# Patient Record
Sex: Female | Born: 1990 | Race: White | Hispanic: No | Marital: Married | Smoking: Never smoker
Health system: Southern US, Community
[De-identification: ages and names within clinical notes are randomized; demographics above are authoritative.]

## PROBLEM LIST (undated history)

## (undated) DIAGNOSIS — S83211A Bucket-handle tear of medial meniscus, current injury, right knee, initial encounter: Secondary | ICD-10-CM

## (undated) DIAGNOSIS — M23321 Other meniscus derangements, posterior horn of medial meniscus, right knee: Secondary | ICD-10-CM

## (undated) DIAGNOSIS — S83519A Sprain of anterior cruciate ligament of unspecified knee, initial encounter: Secondary | ICD-10-CM

## (undated) DIAGNOSIS — S83511A Sprain of anterior cruciate ligament of right knee, initial encounter: Secondary | ICD-10-CM

## (undated) HISTORY — PX: NO PAST SURGERIES: SHX2092

---
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
## 2011-03-04 NOTE — L&D Delivery Note (Signed)
NSD of viable 7 pound 4 oz. Female, 9/9 Apgars over intact perineum.  PDI.  Grossly normal.  2A/1V.  Small vaginal tears repaired with 3-0 Vicryl.  Breast feed.

## 2011-03-12 ENCOUNTER — Encounter (HOSPITAL_COMMUNITY): Payer: Self-pay | Admitting: Anesthesiology

## 2011-03-12 ENCOUNTER — Encounter (HOSPITAL_COMMUNITY): Payer: Self-pay | Admitting: *Deleted

## 2011-03-12 ENCOUNTER — Inpatient Hospital Stay (HOSPITAL_COMMUNITY): Payer: 59 | Admitting: Anesthesiology

## 2011-03-12 ENCOUNTER — Inpatient Hospital Stay (HOSPITAL_COMMUNITY)
Admission: AD | Admit: 2011-03-12 | Discharge: 2011-03-13 | DRG: 775 | Disposition: A | Discharge status: Home / Self Care | Payer: 59 | Source: Ambulatory Visit | Attending: Obstetrics and Gynecology | Admitting: Obstetrics and Gynecology

## 2011-03-12 LAB — GC/CHLAMYDIA PROBE AMP, GENITAL: Chlamydia: NEGATIVE

## 2011-03-12 LAB — ABO/RH: RH Type: POSITIVE

## 2011-03-12 LAB — CBC
HCT: 37 % (ref 36.0–46.0)
MCHC: 33.5 g/dL (ref 30.0–36.0)
MCV: 92.5 fL (ref 78.0–100.0)
RDW: 13.1 % (ref 11.5–15.5)

## 2011-03-12 LAB — HEPATITIS B SURFACE ANTIGEN: Hepatitis B Surface Ag: NEGATIVE

## 2011-03-12 MED ORDER — SODIUM CHLORIDE 0.9 % IJ SOLN: 3.0000 mL | INTRAMUSCULAR | Status: DC | PRN

## 2011-03-12 MED ORDER — FENTANYL 2.5 MCG/ML BUPIVACAINE 1/10 % EPIDURAL INFUSION (WH - ANES)
14.0000 mL/h | INTRAMUSCULAR | Status: DC
  Administered 2011-03-12: 16 mL/h via EPIDURAL
  Filled 2011-03-12: qty 60

## 2011-03-12 MED ORDER — IBUPROFEN 600 MG PO TABS
600.0000 mg | ORAL_TABLET | Freq: Four times a day (QID) | ORAL | Status: DC
  Administered 2011-03-12 – 2011-03-13 (×4): 600 mg via ORAL
  Filled 2011-03-12 (×5): qty 1

## 2011-03-12 MED ORDER — SENNOSIDES-DOCUSATE SODIUM 8.6-50 MG PO TABS
2.0000 | ORAL_TABLET | Freq: Every day | ORAL | Status: DC
  Administered 2011-03-12: 2 via ORAL

## 2011-03-12 MED ORDER — EPHEDRINE 5 MG/ML INJ: 10.0000 mg | INTRAVENOUS | Status: DC | PRN

## 2011-03-12 MED ORDER — ZOLPIDEM TARTRATE 5 MG PO TABS: 5.0000 mg | ORAL_TABLET | Freq: Every evening | ORAL | Status: DC | PRN

## 2011-03-12 MED ORDER — OXYCODONE-ACETAMINOPHEN 5-325 MG PO TABS: 2.0000 | ORAL_TABLET | ORAL | Status: DC | PRN

## 2011-03-12 MED ORDER — OXYTOCIN 20 UNITS IN LACTATED RINGERS INFUSION - SIMPLE
1.0000 m[IU]/min | INTRAVENOUS | Status: DC
  Administered 2011-03-12: 2 m[IU]/min via INTRAVENOUS

## 2011-03-12 MED ORDER — ONDANSETRON HCL 4 MG/2ML IJ SOLN: 4.0000 mg | Freq: Four times a day (QID) | INTRAMUSCULAR | Status: DC | PRN

## 2011-03-12 MED ORDER — OXYTOCIN BOLUS FROM INFUSION
500.0000 mL | Freq: Once | INTRAVENOUS | Status: DC
  Filled 2011-03-12: qty 1000
  Filled 2011-03-12: qty 500

## 2011-03-12 MED ORDER — PRENATAL MULTIVITAMIN CH
1.0000 | ORAL_TABLET | Freq: Every day | ORAL | Status: DC
  Administered 2011-03-13: 1 via ORAL
  Filled 2011-03-12: qty 1

## 2011-03-12 MED ORDER — LIDOCAINE HCL (PF) 1 % IJ SOLN
30.0000 mL | INTRAMUSCULAR | Status: DC | PRN
  Filled 2011-03-12: qty 30

## 2011-03-12 MED ORDER — IBUPROFEN 600 MG PO TABS
600.0000 mg | ORAL_TABLET | Freq: Four times a day (QID) | ORAL | Status: DC | PRN
  Administered 2011-03-12: 600 mg via ORAL
  Filled 2011-03-12: qty 1

## 2011-03-12 MED ORDER — LACTATED RINGERS IV SOLN: 500.0000 mL | Freq: Once | INTRAVENOUS | Status: DC

## 2011-03-12 MED ORDER — LIDOCAINE HCL 1.5 % IJ SOLN
INTRAMUSCULAR | Status: DC | PRN
  Administered 2011-03-12: 3 mL via INTRADERMAL
  Administered 2011-03-12 (×2): 4 mL via INTRADERMAL

## 2011-03-12 MED ORDER — EPHEDRINE 5 MG/ML INJ
10.0000 mg | INTRAVENOUS | Status: DC | PRN
  Filled 2011-03-12: qty 4

## 2011-03-12 MED ORDER — DIBUCAINE 1 % RE OINT
1.0000 "application " | TOPICAL_OINTMENT | RECTAL | Status: DC | PRN
  Administered 2011-03-12: 1 via RECTAL
  Filled 2011-03-12: qty 28

## 2011-03-12 MED ORDER — ONDANSETRON HCL 4 MG/2ML IJ SOLN: 4.0000 mg | INTRAMUSCULAR | Status: DC | PRN

## 2011-03-12 MED ORDER — LACTATED RINGERS IV SOLN
500.0000 mL | INTRAVENOUS | Status: DC | PRN
  Administered 2011-03-12: 1000 mL via INTRAVENOUS

## 2011-03-12 MED ORDER — ACETAMINOPHEN 325 MG PO TABS: 650.0000 mg | ORAL_TABLET | ORAL | Status: DC | PRN

## 2011-03-12 MED ORDER — FLEET ENEMA 7-19 GM/118ML RE ENEM: 1.0000 | ENEMA | RECTAL | Status: DC | PRN

## 2011-03-12 MED ORDER — TETANUS-DIPHTH-ACELL PERTUSSIS 5-2.5-18.5 LF-MCG/0.5 IM SUSP: 0.5000 mL | Freq: Once | INTRAMUSCULAR | Status: DC

## 2011-03-12 MED ORDER — WITCH HAZEL-GLYCERIN EX PADS
1.0000 "application " | MEDICATED_PAD | CUTANEOUS | Status: DC | PRN
  Administered 2011-03-12: 1 via TOPICAL

## 2011-03-12 MED ORDER — SODIUM CHLORIDE 0.9 % IV SOLN: 250.0000 mL | INTRAVENOUS | Status: DC | PRN

## 2011-03-12 MED ORDER — DIPHENHYDRAMINE HCL 25 MG PO CAPS: 25.0000 mg | ORAL_CAPSULE | Freq: Four times a day (QID) | ORAL | Status: DC | PRN

## 2011-03-12 MED ORDER — OXYCODONE-ACETAMINOPHEN 5-325 MG PO TABS: 1.0000 | ORAL_TABLET | ORAL | Status: DC | PRN

## 2011-03-12 MED ORDER — PHENYLEPHRINE 40 MCG/ML (10ML) SYRINGE FOR IV PUSH (FOR BLOOD PRESSURE SUPPORT): 80.0000 ug | PREFILLED_SYRINGE | INTRAVENOUS | Status: DC | PRN

## 2011-03-12 MED ORDER — LANOLIN HYDROUS EX OINT: TOPICAL_OINTMENT | CUTANEOUS | Status: DC | PRN

## 2011-03-12 MED ORDER — TERBUTALINE SULFATE 1 MG/ML IJ SOLN: 0.2500 mg | Freq: Once | INTRAMUSCULAR | Status: DC | PRN

## 2011-03-12 MED ORDER — ONDANSETRON HCL 4 MG PO TABS: 4.0000 mg | ORAL_TABLET | ORAL | Status: DC | PRN

## 2011-03-12 MED ORDER — CITRIC ACID-SODIUM CITRATE 334-500 MG/5ML PO SOLN: 30.0000 mL | ORAL | Status: DC | PRN

## 2011-03-12 MED ORDER — BENZOCAINE-MENTHOL 20-0.5 % EX AERO
INHALATION_SPRAY | CUTANEOUS | Status: AC
  Administered 2011-03-12: 1 via TOPICAL
  Filled 2011-03-12: qty 56

## 2011-03-12 MED ORDER — DIPHENHYDRAMINE HCL 50 MG/ML IJ SOLN: 12.5000 mg | INTRAMUSCULAR | Status: DC | PRN

## 2011-03-12 MED ORDER — SODIUM CHLORIDE 0.9 % IJ SOLN: 3.0000 mL | Freq: Two times a day (BID) | INTRAMUSCULAR | Status: DC

## 2011-03-12 MED ORDER — OXYTOCIN 20 UNITS IN LACTATED RINGERS INFUSION - SIMPLE
125.0000 mL/h | Freq: Once | INTRAVENOUS | Status: AC
  Administered 2011-03-12: 125 mL/h via INTRAVENOUS

## 2011-03-12 MED ORDER — BENZOCAINE-MENTHOL 20-0.5 % EX AERO
1.0000 "application " | INHALATION_SPRAY | CUTANEOUS | Status: DC | PRN
  Administered 2011-03-12: 1 via TOPICAL

## 2011-03-12 MED ORDER — PHENYLEPHRINE 40 MCG/ML (10ML) SYRINGE FOR IV PUSH (FOR BLOOD PRESSURE SUPPORT)
80.0000 ug | PREFILLED_SYRINGE | INTRAVENOUS | Status: DC | PRN
  Filled 2011-03-12: qty 5

## 2011-03-12 MED ORDER — SIMETHICONE 80 MG PO CHEW: 80.0000 mg | CHEWABLE_TABLET | ORAL | Status: DC | PRN

## 2011-03-12 MED ORDER — LACTATED RINGERS IV SOLN
INTRAVENOUS | Status: DC
  Administered 2011-03-12: 05:00:00 via INTRAVENOUS

## 2011-03-12 NOTE — H&P (Signed)
21 y.o. @ 39.2  G1P0 comes in c/o SROM.  Otherwise has good fetal movement and no bleeding.  History reviewed. No pertinent past medical history. History reviewed. No pertinent past surgical history.  OB History    Grav Para Term Preterm Abortions TAB SAB Ect Mult Living   1              # Outc Date GA Lbr Len/2nd Wgt Sex Del Anes PTL Lv   1 CUR               History   Social History  . Marital Status: Married    Spouse Name: N/A    Number of Children: N/A  . Years of Education: N/A   Occupational History  . Not on file.   Social History Main Topics  . Smoking status: Never Smoker   . Smokeless tobacco: Never Used  . Alcohol Use: No  . Drug Use: No  . Sexually Active: Yes    Birth Control/ Protection: None   Other Topics Concern  . Not on file   Social History Narrative  . No narrative on file   Review of patient's allergies indicates no known allergies.   Prenatal Course:  Uncomplicated, although no-showed 3 appointments.  All PNLs wnl.  Filed Vitals:   03/12/11 0413  BP: 130/74  Pulse: 84  Temp: 98.1 F (36.7 C)  Resp: 20    Per MAU staff: Lungs/Cor:  NAD Abdomen:  soft, gravid Ex:  no cords, erythema SVE:  4/90/-2, head well applied FHTs:  120, good STV, NST R Toco:  q 6-7   A/P   Admit to L&D  GBS Neg, A+, RI  Epidural when desired  Continuous EFM/ TOCO  Start pitocin 2x2 if no change with next cervical check  Veda Arrellano

## 2011-03-12 NOTE — Anesthesia Procedure Notes (Signed)
Epidural Patient location during procedure: OB Start time: 03/12/2011 7:23 AM Reason for block: procedure for pain  Staffing Performed by: anesthesiologist   Preanesthetic Checklist Completed: patient identified, site marked, surgical consent, pre-op evaluation, timeout performed, IV checked, risks and benefits discussed and monitors and equipment checked  Epidural Patient position: sitting Prep: site prepped and draped and DuraPrep Patient monitoring: continuous pulse ox and blood pressure Approach: midline Injection technique: LOR air  Needle:  Needle type: Tuohy  Needle gauge: 17 G Needle length: 9 cm Needle insertion depth: 5 cm cm Catheter type: closed end flexible Catheter size: 19 Gauge Catheter at skin depth: 10 cm Test dose: negative  Assessment Events: blood not aspirated, injection not painful, no injection resistance, negative IV test and no paresthesia  Additional Notes Discussed risk of headache, infection, bleeding, nerve injury and failed or incomplete block.  Patient voices understanding and wishes to proceed.

## 2011-03-12 NOTE — Anesthesia Postprocedure Evaluation (Signed)
  Anesthesia Post-op Note  Patient: Debra Fox  Procedure(s) Performed: * No procedures listed *  Patient Location: PACU and Mother/Baby  Anesthesia Type: Epidural  Level of Consciousness: awake, alert  and oriented  Airway and Oxygen Therapy: Patient Spontanous Breathing and Patient connected to face mask oxygen  Post-op Pain: mild  Post-op Assessment: Post-op Vital signs reviewed, No headache, No backache, No residual numbness and No residual motor weakness  Post-op Vital Signs: stable  Complications: No apparent anesthesia complications

## 2011-03-12 NOTE — Progress Notes (Signed)
Delivery of live viable female by Dr Aldona Bar. APGARS 9,9

## 2011-03-12 NOTE — Anesthesia Preprocedure Evaluation (Signed)
Anesthesia Evaluation  Patient identified by MRN, date of birth, ID band Patient awake    Reviewed: Allergy & Precautions, H&P , NPO status , Patient's Chart, lab work & pertinent test results, reviewed documented beta blocker date and time   History of Anesthesia Complications Negative for: history of anesthetic complications  Airway Mallampati: II TM Distance: >3 FB Neck ROM: full    Dental  (+) Teeth Intact   Pulmonary neg pulmonary ROS,  clear to auscultation        Cardiovascular neg cardio ROS regular Normal    Neuro/Psych Negative Neurological ROS  Negative Psych ROS   GI/Hepatic negative GI ROS, Neg liver ROS,   Endo/Other  Negative Endocrine ROS  Renal/GU negative Renal ROS     Musculoskeletal   Abdominal   Peds  Hematology negative hematology ROS (+)   Anesthesia Other Findings   Reproductive/Obstetrics (+) Pregnancy                           Anesthesia Physical Anesthesia Plan  ASA: II  Anesthesia Plan: Epidural   Post-op Pain Management:    Induction:   Airway Management Planned:   Additional Equipment:   Intra-op Plan:   Post-operative Plan:   Informed Consent: I have reviewed the patients History and Physical, chart, labs and discussed the procedure including the risks, benefits and alternatives for the proposed anesthesia with the patient or authorized representative who has indicated his/her understanding and acceptance.     Plan Discussed with:   Anesthesia Plan Comments:         Anesthesia Quick Evaluation  

## 2011-03-13 LAB — CBC
HCT: 35.6 % — ABNORMAL LOW (ref 36.0–46.0)
RDW: 13.5 % (ref 11.5–15.5)
WBC: 11.1 10*3/uL — ABNORMAL HIGH (ref 4.0–10.5)

## 2011-03-13 NOTE — Progress Notes (Signed)
UR chart review completed.  

## 2011-03-13 NOTE — Progress Notes (Signed)
Patient is eating, ambulating, voiding.  Pain control is good.  Filed Vitals:   03/12/11 1330 03/12/11 2233 03/13/11 0130 03/13/11 0557  BP: 113/64 122/70 107/72 128/82  Pulse: 87 80 87 81  Temp: 98.5 F (36.9 C) 98.8 F (37.1 C) 97.6 F (36.4 C) 98.1 F (36.7 C)  TempSrc: Oral Oral Oral Oral  Resp: 18 20 20 18   Height:      Weight:      SpO2:        Fundus firm Perineum without swelling.  Lab Results  Component Value Date   WBC 11.1* 03/13/2011   HGB 11.7* 03/13/2011   HCT 35.6* 03/13/2011   MCV 94.2 03/13/2011   PLT 206 03/13/2011    A/Positive/-- (01/09 0000)/RI  A/P Post partum day 1.  Routine care.  Expect d/c tomorrow.    Milina Pagett A

## 2011-03-13 NOTE — Discharge Summary (Signed)
Obstetric Discharge Summary Reason for Admission: onset of labor Prenatal Procedures: none Intrapartum Procedures: spontaneous vaginal delivery Postpartum Procedures: none Complications-Operative and Postpartum: none Hemoglobin  Date Value Range Status  03/13/2011 11.7* 12.0-15.0 (g/dL) Final     HCT  Date Value Range Status  03/13/2011 35.6* 36.0-46.0 (%) Final    Discharge Diagnoses: Term Pregnancy-delivered  Discharge Information: Date: 03/13/2011 Activity: pelvic rest Diet: routine Medications: Ibuprofen Condition: stable Instructions: refer to practice specific booklet Discharge to: home Follow-up Information    Follow up with [facility] in 4 weeks.   Contact information:   [address] [place] [address] [phone]          Newborn Data: Live born female  Birth Weight: 7 lb 4 oz (3289 g) APGAR: 9, 9  Home with mother.  Debra Fox A 03/13/2011, 2:57 PM

## 2011-03-13 NOTE — Progress Notes (Signed)
Pt desires d/c- ok for baby to be d/ced as well. Kesia Dalto A

## 2011-03-20 ENCOUNTER — Ambulatory Visit (HOSPITAL_COMMUNITY)
Admission: RE | Admit: 2011-03-20 | Discharge: 2011-03-20 | Disposition: A | Discharge status: Home / Self Care | Payer: 59 | Source: Ambulatory Visit | Attending: Obstetrics and Gynecology | Admitting: Obstetrics and Gynecology

## 2011-03-20 NOTE — Progress Notes (Signed)
Adult Lactation Consultation Outpatient Visit Note  Patient Name: Carmie Loree Fee Date of Birth: 05-Mar-1990    03/12/2011 Gestational Age at Delivery: [redacted]w[redacted]d Type of Delivery: vaginal  Breastfeeding History: Frequency of Breastfeeding: 2-3 hrs during the day, and 1 4 hr stretch at night Length of Feeding: 30 mins Voids:  2-3 Stools: 3-5 yellow   Supplementing / Method: Pumping:  Type of Pump: manual pump   Frequency: once a day  Volume:  4 oz, but decreased to 1 oz recently  Comments: Mom called and was concerned that her milk supply was decreasing.  She was using the manual breast pump and the volume decreased from 4 oz to 1 oz past couple days, when she would pump 15 mins.  Also, her breasts went from very full and heavy, and now feel softer. Nipples feel sore on initial latch, but it improves as Mom adjusts baby's latch while she is feeding.  Mom pumps using a manual pump 1 time a day and bottle feeds baby 1oz of EBM.  Baby weighed 7 lbs 4 oz at birth, at day after discharge weighed 6 lbs. 8 oz. (1/11), and today at 83 days old, baby weighs 7 lbs. 1.9 oz which is consistent with an adequate weight gain of 1 oz per day.   Consultation Evaluation: Mom denies breast surgery, medications, or smoking.  Bilateral nipple trauma, some blisters, and some scabbed cracks noted, more on right.  Mom states they were bleeding, but has gotten much better.  Wearing sore nipple shells, and using lanolin.  In office, Mom latched baby too shallow, so assisted her to position baby and latch more quickly and deeply resulting in a more comfortable latch per Mom.  Baby nursed rhythmically, but only obtained 12 ml from left side.  Switched to 2nd breast, and baby latched very deeply, and baby fed for 20 mins, but only obtained 14 ml.  Mom says she has not supplemented baby.  Baby's weight gain is right on target~ 1 oz per day.  Talked about each feeding at breast being different.  Recommended a DEBP for  stimulating her milk supply, but wants to wait to get one from her insurance policy.  She will call and check on this tomorrow.  Until then, she has a manual breast pump and will use that as needed.  Instructions to prevent and treat sore nipples given: -Make sure infant's mouth is opened wide before she is pulled in close and that the infant gets as much of the areola as possible into her mouth allowing nose to be close to breast. -Baby's lips should be flanged outward to improve seal around areola -Do not press down to make "air space" for baby's nose, as this will pull nipple out of correct position in -Remove the baby from the breast by breaking the suction, inserting finger into corner of baby's mouth -Begin feeding on the side that is least sore first -Hand or pump express a few minutes prior to feeding to initiate flow -Alternate nursing positions for shorter duration at each latch -rub expressed breastmilk onto nipple and allow to air dry -wear breast shells between feeding to encourage air circulation and to promote healing -may use analgesic as prescribed by MD or lanolin as necessary if not at risk for Thrush or allergic to wool.   Follow-Up Call our office if wet diapers decrease to <6 per day and stools <2 day Next appointment 03/25/11 at 9am for another feeding assessment  Judee Clara 03/20/2011, 2:00 PM

## 2011-03-25 ENCOUNTER — Ambulatory Visit (HOSPITAL_COMMUNITY)
Admission: RE | Admit: 2011-03-25 | Discharge: 2011-03-25 | Disposition: A | Discharge status: Home / Self Care | Payer: 59 | Source: Ambulatory Visit | Attending: Obstetrics and Gynecology | Admitting: Obstetrics and Gynecology

## 2011-03-25 NOTE — Progress Notes (Signed)
Adult Lactation Consultation Outpatient Visit Note  Patient Name: Keltie Labell                 BABY:  LYLAH BRIDGES Date of Birth: [birth year]                           DOB:  [birth year]  24 DAYS OLD Gestational Age at Delivery: [redacted]w[redacted]d        BIRTH WEIGHT: 7-4     WEIGHT TODAY:  7-2.4 Type of Delivery:   Breastfeeding History: Frequency of Breastfeeding: EVERY 3-4 HOURS Length of Feeding: 20 30 MINUTES TOTAL, BOTH BREASTS EACH FEEDING Voids: QS Stools: QS  Supplementing / Method:   BOTTLE/EBM  4-8 OZ/DAY Pumping:  Type of Pump:PUMP IN STYLE   Frequency: TWICE PER DAY  Volume:  3 OZ EACH BREAST  Comments:MOM FEELS LIKE BREASTFEEDING AND NIPPLE PAIN HAS IMPROVED GREATLY SINCE LAST WEEK.  ALTHOUGH BABY HAS ONLY GAINED .5 OZ/7 DAYS.  MOTHER SOMETIMES LETTING BABY GO 4 HOURS BETWEEN FEEDINGS.  NIPPLES HEALED.  BABY OBSERVED ON BOTH BREASTS WITH DEEP LATCH.  TOTAL PC MILK TRANSFER AFTER 25 MINUTES 40 MLS.  PLAN IS TO INCREASE FREQUENCY OF FEEDINGS, PUMP BOTH BREASTS X 15 MINUTES AFTER FEEDS AND SUPPLEMENT EACH FEEDING WITH 30-40 MLS.  WEIGHT CHECK IN ONE WEEK.    Consultation Evaluation:  Initial Feeding Assessment: Pre-feed [id number]:0454 Post-feed UJWJXB:1478 Amount Transferred:22 MLS Comments:  Additional Feeding Assessment: Pre-feed [id number]:3086 Post-feed VHQION:6295 Amount Transferred:18 MLS Comments:  Additional Feeding Assessment: Pre-feed Weight: Post-feed Weight: Amount Transferred: Comments:  Total Breast milk Transferred this Visit:  Total Supplement Given:   Additional Interventions:   Follow-Up  WEIGHT CHECK 04/01/11 1:00PM      Hansel Feinstein 03/25/2011, 10:17 AM

## 2011-04-01 ENCOUNTER — Inpatient Hospital Stay (HOSPITAL_COMMUNITY): Admission: RE | Admit: 2011-04-01 | Payer: 59 | Source: Ambulatory Visit

## 2011-04-18 ENCOUNTER — Ambulatory Visit (HOSPITAL_COMMUNITY)
Admission: RE | Admit: 2011-04-18 | Discharge: 2011-04-18 | Disposition: A | Discharge status: Home / Self Care | Payer: 59 | Source: Ambulatory Visit | Attending: Obstetrics and Gynecology | Admitting: Obstetrics and Gynecology

## 2011-04-18 NOTE — Progress Notes (Signed)
Adult Lactation Consultation Outpatient Visit Note  Patient Name: Debra Fox Date of Birth: 29-Jan-1991 Gestational Age at Delivery: Unknown Type of Delivery:   Breastfeeding History: Frequency of Breastfeeding:  Length of Feeding:  Voids:  Stools:   Supplementing / Method: Pumping:  Type of Pump:free style medela   Frequency:8 times daily  Volume:  noramally 6-8 ounces, decreased since mastitis to 3-4 ounces per pumping  Comments: Follow visit for mastitis. New since last visit is itchy , burning painful nipples   Consultation Evaluation: [name] nipples are deep pink and both have tiny broken areas that appear to be like popped blisters. Nipples have yellow/white crusty patches in center of nipple. Mother states that she has been observing problem x 1 week. She does complain of burning and stinging when air hits nipples. Mother also states that she has had pain when pumping.   Initial Feeding Assessment: Pre-feed Weight: Post-feed Weight: Amount Transferred: Comments:  Additional Feeding Assessment: Pre-feed Weight: Post-feed Weight: Amount Transferred: Comments:  Additional Feeding Assessment: Pre-feed Weight: Post-feed Weight: Amount Transferred: Comments:  Total Breast milk Transferred this Visit:  Total Supplement Given:   Additional Interventions: Recommended that [name] be treated for yeast systems. inst to phone OB to get DR Newmans nipple cream and Dyflucan inst to call Dr Arletha Grippe and ask about Nystatin sus. for infant.  Mother was given teaching sheet for yeast. Mother was given #27 flanges to provide for better comfort during pumping.  Follow-Up  PRN    Stevan Born [facility] 04/18/2011, 3:33 PM

## 2013-03-03 NOTE — L&D Delivery Note (Signed)
Delivery Note At 5:22 AM a viable and healthy female was delivered via  (Presentation: OA).  APGAR: 9,9; weight -pending Placenta status: spontaneous, intact, but membranes removed by manual exploration, .  Cord: 3 vessel, normal,  with the following complications: -none/   Anesthesia: Epidural  Episiotomy: na Lacerations: none Suture Repair: na Est. Blood Loss (mL): 350cc  Mom to postpartum.  Baby to Couplet care / Skin to Skin.  Aleila Syverson R 12/05/2013, 5:56 AM

## 2013-12-04 ENCOUNTER — Inpatient Hospital Stay (HOSPITAL_COMMUNITY)
Admission: AD | Admit: 2013-12-04 | Discharge: 2013-12-04 | Disposition: A | Discharge status: Home / Self Care | Payer: 59 | Source: Ambulatory Visit | Attending: Obstetrics & Gynecology | Admitting: Obstetrics & Gynecology

## 2013-12-04 ENCOUNTER — Encounter (HOSPITAL_COMMUNITY): Payer: Self-pay

## 2013-12-04 DIAGNOSIS — O9989 Other specified diseases and conditions complicating pregnancy, childbirth and the puerperium: Secondary | ICD-10-CM | POA: Diagnosis present

## 2013-12-04 LAB — POCT FERN TEST

## 2013-12-04 LAB — AMNISURE RUPTURE OF MEMBRANE (ROM) NOT AT ARMC: AMNISURE: NEGATIVE

## 2013-12-04 NOTE — MAU Note (Signed)
Pt presents to MAU with complaints of LOF since 5am. Denies any vaginal bleeding.

## 2013-12-04 NOTE — MAU Note (Signed)
Pt states leaking of fluid at 0500 this am. Blood tinged. Was 1cm in office last visit.

## 2013-12-04 NOTE — Progress Notes (Signed)
Pt here for r/o rupture, c/o trickle of fluid. Notified pt had negative fern slide, order for amnisure.

## 2013-12-04 NOTE — Discharge Instructions (Signed)
Third Trimester of Pregnancy The third trimester is from week 29 through week 42, months 7 through 9. This trimester is when your unborn baby (fetus) is growing very fast. At the end of the ninth month, the unborn baby is about 20 inches in length. It weighs about 6-10 pounds.  HOME CARE   Avoid all smoking, herbs, and alcohol. Avoid drugs not approved by your doctor.  Only take medicine as told by your doctor. Some medicines are safe and some are not during pregnancy.  Exercise only as told by your doctor. Stop exercising if you start having cramps.  Eat regular, healthy meals.  Wear a good support bra if your breasts are tender.  Do not use hot tubs, steam rooms, or saunas.  Wear your seat belt when driving.  Avoid raw meat, uncooked cheese, and liter boxes and soil used by cats.  Take your prenatal vitamins.  Try taking medicine that helps you poop (stool softener) as needed, and if your doctor approves. Eat more fiber by eating fresh fruit, vegetables, and whole grains. Drink enough fluids to keep your pee (urine) clear or pale yellow.  Take warm water baths (sitz baths) to soothe pain or discomfort caused by hemorrhoids. Use hemorrhoid cream if your doctor approves.  If you have puffy, bulging veins (varicose veins), wear support hose. Raise (elevate) your feet for 15 minutes, 3-4 times a day. Limit salt in your diet.  Avoid heavy lifting, wear low heels, and sit up straight.  Rest with your legs raised if you have leg cramps or low back pain.  Visit your dentist if you have not gone during your pregnancy. Use a soft toothbrush to brush your teeth. Be gentle when you floss.  You can have sex (intercourse) unless your doctor tells you not to.  Do not travel far distances unless you must. Only do so with your doctor's approval.  Take prenatal classes.  Practice driving to the hospital.  Pack your hospital bag.  Prepare the baby's room.  Go to your doctor visits. GET  HELP IF:  You are not sure if you are in labor or if your water has broken.  You are dizzy.  You have mild cramps or pressure in your lower belly (abdominal).  You have a nagging pain in your belly area.  You continue to feel sick to your stomach (nauseous), throw up (vomit), or have watery poop (diarrhea).  You have bad smelling fluid coming from your vagina.  You have pain with peeing (urination). GET HELP RIGHT AWAY IF:   You have a fever.  You are leaking fluid from your vagina.  You are spotting or bleeding from your vagina.  You have severe belly cramping or pain.  You lose or gain weight rapidly.  You have trouble catching your breath and have chest pain.  You notice sudden or extreme puffiness (swelling) of your face, hands, ankles, feet, or legs.  You have not felt the baby move in over an hour.  You have severe headaches that do not go away with medicine.  You have vision changes. Document Released: 05/14/2009 Document Revised: 06/14/2012 Document Reviewed: 04/20/2012 ExitCare Patient Information 2015 [employer]. This information is not intended to replace advice given to you by your health care provider. Make sure you discuss any questions you have with your health care provider.  

## 2013-12-04 NOTE — Progress Notes (Signed)
Notified amnisure negative, will d/c home with labor precautions.

## 2013-12-05 ENCOUNTER — Inpatient Hospital Stay (HOSPITAL_COMMUNITY): Payer: 59 | Admitting: Anesthesiology

## 2013-12-05 ENCOUNTER — Inpatient Hospital Stay (HOSPITAL_COMMUNITY)
Admission: AD | Admit: 2013-12-05 | Discharge: 2013-12-06 | DRG: 774 | Disposition: A | Discharge status: Home / Self Care | Payer: 59 | Source: Ambulatory Visit | Attending: Obstetrics & Gynecology | Admitting: Obstetrics & Gynecology

## 2013-12-05 ENCOUNTER — Encounter (HOSPITAL_COMMUNITY): Payer: 59 | Admitting: Anesthesiology

## 2013-12-05 ENCOUNTER — Encounter (HOSPITAL_COMMUNITY): Payer: Self-pay | Admitting: *Deleted

## 2013-12-05 DIAGNOSIS — O3663X0 Maternal care for excessive fetal growth, third trimester, not applicable or unspecified: Secondary | ICD-10-CM | POA: Diagnosis present

## 2013-12-05 DIAGNOSIS — IMO0001 Reserved for inherently not codable concepts without codable children: Secondary | ICD-10-CM

## 2013-12-05 DIAGNOSIS — O9832 Other infections with a predominantly sexual mode of transmission complicating childbirth: Secondary | ICD-10-CM | POA: Diagnosis present

## 2013-12-05 DIAGNOSIS — Z3A38 38 weeks gestation of pregnancy: Secondary | ICD-10-CM | POA: Diagnosis present

## 2013-12-05 DIAGNOSIS — A6000 Herpesviral infection of urogenital system, unspecified: Secondary | ICD-10-CM | POA: Diagnosis present

## 2013-12-05 DIAGNOSIS — Z3483 Encounter for supervision of other normal pregnancy, third trimester: Secondary | ICD-10-CM | POA: Diagnosis present

## 2013-12-05 LAB — OB RESULTS CONSOLE GBS: STREP GROUP B AG: NEGATIVE

## 2013-12-05 LAB — CBC
HCT: 37.6 % (ref 36.0–46.0)
Hemoglobin: 12.8 g/dL (ref 12.0–15.0)
MCH: 32.1 pg (ref 26.0–34.0)
MCHC: 34 g/dL (ref 30.0–36.0)
MCV: 94.2 fL (ref 78.0–100.0)
PLATELETS: 231 10*3/uL (ref 150–400)
RBC: 3.99 MIL/uL (ref 3.87–5.11)
RDW: 13.4 % (ref 11.5–15.5)
WBC: 12.5 10*3/uL — ABNORMAL HIGH (ref 4.0–10.5)

## 2013-12-05 LAB — ABO/RH: ABO/RH(D): A POS

## 2013-12-05 LAB — OB RESULTS CONSOLE RPR: RPR: NONREACTIVE

## 2013-12-05 LAB — TYPE AND SCREEN
ABO/RH(D): A POS
Antibody Screen: NEGATIVE

## 2013-12-05 LAB — OB RESULTS CONSOLE HIV ANTIBODY (ROUTINE TESTING): HIV: NONREACTIVE

## 2013-12-05 LAB — OB RESULTS CONSOLE HEPATITIS B SURFACE ANTIGEN: Hepatitis B Surface Ag: NEGATIVE

## 2013-12-05 LAB — RPR

## 2013-12-05 LAB — OB RESULTS CONSOLE RUBELLA ANTIBODY, IGM: RUBELLA: IMMUNE

## 2013-12-05 MED ORDER — SENNOSIDES-DOCUSATE SODIUM 8.6-50 MG PO TABS
2.0000 | ORAL_TABLET | ORAL | Status: DC
  Administered 2013-12-05: 2 via ORAL
  Filled 2013-12-05: qty 2

## 2013-12-05 MED ORDER — LACTATED RINGERS IV SOLN: 500.0000 mL | INTRAVENOUS | Status: DC | PRN

## 2013-12-05 MED ORDER — DIBUCAINE 1 % RE OINT: 1.0000 "application " | TOPICAL_OINTMENT | RECTAL | Status: DC | PRN

## 2013-12-05 MED ORDER — LACTATED RINGERS IV SOLN
500.0000 mL | Freq: Once | INTRAVENOUS | Status: AC
  Administered 2013-12-05: 500 mL via INTRAVENOUS

## 2013-12-05 MED ORDER — ONDANSETRON HCL 4 MG PO TABS: 4.0000 mg | ORAL_TABLET | ORAL | Status: DC | PRN

## 2013-12-05 MED ORDER — ONDANSETRON HCL 4 MG/2ML IJ SOLN: 4.0000 mg | Freq: Four times a day (QID) | INTRAMUSCULAR | Status: DC | PRN

## 2013-12-05 MED ORDER — ZOLPIDEM TARTRATE 5 MG PO TABS: 5.0000 mg | ORAL_TABLET | Freq: Every evening | ORAL | Status: DC | PRN

## 2013-12-05 MED ORDER — WITCH HAZEL-GLYCERIN EX PADS: 1.0000 "application " | MEDICATED_PAD | CUTANEOUS | Status: DC | PRN

## 2013-12-05 MED ORDER — OXYCODONE-ACETAMINOPHEN 5-325 MG PO TABS: 2.0000 | ORAL_TABLET | ORAL | Status: DC | PRN

## 2013-12-05 MED ORDER — SIMETHICONE 80 MG PO CHEW: 80.0000 mg | CHEWABLE_TABLET | ORAL | Status: DC | PRN

## 2013-12-05 MED ORDER — EPHEDRINE 5 MG/ML INJ
10.0000 mg | INTRAVENOUS | Status: DC | PRN
  Filled 2013-12-05: qty 2

## 2013-12-05 MED ORDER — DIPHENHYDRAMINE HCL 50 MG/ML IJ SOLN: 12.5000 mg | INTRAMUSCULAR | Status: DC | PRN

## 2013-12-05 MED ORDER — TETANUS-DIPHTH-ACELL PERTUSSIS 5-2.5-18.5 LF-MCG/0.5 IM SUSP
0.5000 mL | Freq: Once | INTRAMUSCULAR | Status: DC
Start: 2013-12-06 — End: 2013-12-05

## 2013-12-05 MED ORDER — LIDOCAINE HCL (PF) 1 % IJ SOLN
INTRAMUSCULAR | Status: DC | PRN
  Administered 2013-12-05: 5 mL
  Administered 2013-12-05: 4 mL

## 2013-12-05 MED ORDER — FENTANYL 2.5 MCG/ML BUPIVACAINE 1/10 % EPIDURAL INFUSION (WH - ANES)
INTRAMUSCULAR | Status: DC | PRN
  Administered 2013-12-05: 16 mL/h via EPIDURAL

## 2013-12-05 MED ORDER — ACETAMINOPHEN 325 MG PO TABS: 650.0000 mg | ORAL_TABLET | ORAL | Status: DC | PRN

## 2013-12-05 MED ORDER — IBUPROFEN 600 MG PO TABS
600.0000 mg | ORAL_TABLET | Freq: Four times a day (QID) | ORAL | Status: DC
  Administered 2013-12-05 – 2013-12-06 (×5): 600 mg via ORAL
  Filled 2013-12-05 (×5): qty 1

## 2013-12-05 MED ORDER — BENZOCAINE-MENTHOL 20-0.5 % EX AERO
1.0000 "application " | INHALATION_SPRAY | CUTANEOUS | Status: DC | PRN
  Administered 2013-12-05: 1 via TOPICAL
  Filled 2013-12-05: qty 56

## 2013-12-05 MED ORDER — OXYCODONE-ACETAMINOPHEN 5-325 MG PO TABS: 1.0000 | ORAL_TABLET | ORAL | Status: DC | PRN

## 2013-12-05 MED ORDER — DIPHENHYDRAMINE HCL 25 MG PO CAPS: 25.0000 mg | ORAL_CAPSULE | Freq: Four times a day (QID) | ORAL | Status: DC | PRN

## 2013-12-05 MED ORDER — PRENATAL MULTIVITAMIN CH
1.0000 | ORAL_TABLET | Freq: Every day | ORAL | Status: DC
Start: 2013-12-05 — End: 2013-12-06
  Administered 2013-12-05 – 2013-12-06 (×2): 1 via ORAL
  Filled 2013-12-05 (×2): qty 1

## 2013-12-05 MED ORDER — ONDANSETRON HCL 4 MG/2ML IJ SOLN: 4.0000 mg | INTRAMUSCULAR | Status: DC | PRN

## 2013-12-05 MED ORDER — OXYTOCIN 40 UNITS IN LACTATED RINGERS INFUSION - SIMPLE MED
62.5000 mL/h | INTRAVENOUS | Status: DC
  Filled 2013-12-05: qty 1000

## 2013-12-05 MED ORDER — PHENYLEPHRINE 40 MCG/ML (10ML) SYRINGE FOR IV PUSH (FOR BLOOD PRESSURE SUPPORT)
80.0000 ug | PREFILLED_SYRINGE | INTRAVENOUS | Status: DC | PRN
  Filled 2013-12-05: qty 2
  Filled 2013-12-05: qty 10

## 2013-12-05 MED ORDER — PHENYLEPHRINE 40 MCG/ML (10ML) SYRINGE FOR IV PUSH (FOR BLOOD PRESSURE SUPPORT)
80.0000 ug | PREFILLED_SYRINGE | INTRAVENOUS | Status: DC | PRN
  Filled 2013-12-05: qty 2

## 2013-12-05 MED ORDER — LACTATED RINGERS IV SOLN: INTRAVENOUS | Status: DC

## 2013-12-05 MED ORDER — LANOLIN HYDROUS EX OINT: TOPICAL_OINTMENT | CUTANEOUS | Status: DC | PRN

## 2013-12-05 MED ORDER — LIDOCAINE HCL (PF) 1 % IJ SOLN
30.0000 mL | INTRAMUSCULAR | Status: DC | PRN
  Filled 2013-12-05: qty 30

## 2013-12-05 MED ORDER — CITRIC ACID-SODIUM CITRATE 334-500 MG/5ML PO SOLN: 30.0000 mL | ORAL | Status: DC | PRN

## 2013-12-05 MED ORDER — FENTANYL 2.5 MCG/ML BUPIVACAINE 1/10 % EPIDURAL INFUSION (WH - ANES)
14.0000 mL/h | INTRAMUSCULAR | Status: DC | PRN
  Administered 2013-12-05: 14 mL/h via EPIDURAL
  Filled 2013-12-05: qty 125

## 2013-12-05 MED ORDER — OXYTOCIN BOLUS FROM INFUSION
500.0000 mL | INTRAVENOUS | Status: DC
  Administered 2013-12-05: 500 mL via INTRAVENOUS

## 2013-12-05 NOTE — Anesthesia Procedure Notes (Signed)
Epidural Patient location during procedure: OB Start time: 12/05/2013 2:35 AM  Staffing Anesthesiologist: Dorlisa Savino A. Performed by: anesthesiologist   Preanesthetic Checklist Completed: patient identified, site marked, surgical consent, pre-op evaluation, timeout performed, IV checked, risks and benefits discussed and monitors and equipment checked  Epidural Patient position: sitting Prep: site prepped and draped and DuraPrep Patient monitoring: continuous pulse ox and blood pressure Approach: midline Location: L3-L4 Injection technique: LOR air  Needle:  Needle type: Tuohy  Needle gauge: 17 G Needle length: 9 cm and 9 Needle insertion depth: 5 cm cm Catheter type: closed end flexible Catheter size: 19 Gauge Catheter at skin depth: 10 cm Test dose: negative and Other  Assessment Events: blood not aspirated, injection not painful, no injection resistance, negative IV test and no paresthesia  Additional Notes Patient identified. Risks and benefits discussed including failed block, incomplete  Pain control, post dural puncture headache, nerve damage, paralysis, blood pressure Changes, nausea, vomiting, reactions to medications-both toxic and allergic and post Partum back pain. All questions were answered. Patient expressed understanding and wished to proceed. Sterile technique was used throughout procedure. Epidural site was Dressed with sterile barrier dressing. No paresthesias, signs of intravascular injection Or signs of intrathecal spread were encountered.  Patient was more comfortable after the epidural was dosed. Please see RN's note for documentation of vital signs and FHR which are stable.

## 2013-12-05 NOTE — Anesthesia Postprocedure Evaluation (Signed)
Anesthesia Post Note  Patient: Debra Fox  Procedure(s) Performed: * No procedures listed *  Anesthesia type: Epidural  Patient location: Mother/Baby  Post pain: Pain level controlled  Post assessment: Post-op Vital signs reviewed  Last Vitals:  Filed Vitals:   12/05/13 0830  BP: 116/61  Pulse: 78  Temp: 36.7 C  Resp: 16    Post vital signs: Reviewed  Level of consciousness:alert  Complications: No apparent anesthesia complications

## 2013-12-05 NOTE — H&P (Signed)
Debra Fox is a 23 y.o. female presenting at 38.3 wks for painful contractions. Good FMs.  Maternal Medical History:  Reason for admission: Contractions.    [facility], then [facility] due to lapse in insurance. Anatomy sono at 23 wks normal. Declined Down's screening. LGA at 36 wks, but since then no further maternal weight gain due to diet changes.  HSV hx, remote outbreaks, is on Valtrex for suppression.   OB History   Grav Para Term Preterm Abortions TAB SAB Ect Mult Living   4 1 1  2 2    1      Past Medical History  Diagnosis Date  . HSV-1 infection     Rare outbreaks   Past Surgical History  Procedure Laterality Date  . No past surgeries     Family History: family history is negative for Anesthesia problems, Hypotension, Malignant hyperthermia, and Pseudochol deficiency. Social History:  reports that she has never smoked. She has never used smokeless tobacco. She reports that she does not drink alcohol or use illicit drugs.   Prenatal Transfer Tool  Maternal Diabetes: No Genetic Screening: Declined Maternal Ultrasounds/Referrals: Normal Fetal Ultrasounds or other Referrals:  None Maternal Substance Abuse:  No Significant Maternal Medications:  Valtrex Significant Maternal Lab Results:  Lab values include: Group B Strep negative Other Comments:  HSV hx with no recent outbreaks.   ROS  Neg   Dilation: 4.5 Effacement (%): 80 Station: -2 Exam by:: Elie ConferK. Weiss RN Blood pressure 133/82, pulse 74, temperature 98.1 F (36.7 C), temperature source Oral, resp. rate 18, height 5\' 10"  (1.778 m), weight 192 lb (87.091 kg). Exam Physical Exam   Prenatal labs: ([facility], [facility]) ABO, Rh:  A(+) Antibody:  negative Rubella:  Immune RPR:   Neg HBsAg:   Neg HIV:   Neg  GBS: Negative (10/05 0000)  Glucose scr 95  Assessment/Plan: 23 yo G4P1021 at 38.3 wks, admitted in active  labor. Prior term SVD (7 + lbs), current EFW 8-8.1/2 lbs. GBS neg. Epidural ok. Anticipate SVD   Tangy Drozdowski R 12/05/2013, 1:56 AM

## 2013-12-05 NOTE — MAU Note (Signed)
Contractions worse tonight. Denies LOF. Some bloody show. Positive fetal movement. Was dilated 1 cm last time checked.

## 2013-12-05 NOTE — Progress Notes (Signed)
Debra Fox is a 23 y.o. G4P1021 at 7442w3d  Subjective: Vaginal pressure   Objective: BP 116/65  Pulse 70  Temp(Src) 98.1 F (36.7 C) (Oral)  Resp 18  Ht 5\' 10"  (1.778 m)  Wt 192 lb (87.091 kg)  BMI 27.55 kg/m2  SpO2 100%     FHT:  FHR: 140 bpm, variability: moderate,  accelerations:  Present,  decelerations:  Absent UC:   regular, every 3 minutes SVE:   Dilation: 10 Effacement (%): 100 Station: +1 Exam by:: C.Byers, RN  Labs: Lab Results  Component Value Date   WBC 12.5* 12/05/2013   HGB 12.8 12/05/2013   HCT 37.6 12/05/2013   MCV 94.2 12/05/2013   PLT 231 12/05/2013    Assessment / Plan: Spontaneous labor, progressing normally  Labor: Progressing normally Preeclampsia:  none Fetal Wellbeing:  Category I Pain Control:  Epidural I/D:  n/a Anticipated MOD:  NSVD  Debra Fox 12/05/2013, 4:20 AM

## 2013-12-05 NOTE — Anesthesia Preprocedure Evaluation (Signed)
Anesthesia Evaluation  Patient identified by MRN, date of birth, ID band Patient awake    Reviewed: Allergy & Precautions, H&P , Patient's Chart, lab work & pertinent test results  Airway Mallampati: II TM Distance: >3 FB Neck ROM: Full    Dental no notable dental hx. (+) Teeth Intact   Pulmonary neg pulmonary ROS,  breath sounds clear to auscultation  Pulmonary exam normal       Cardiovascular negative cardio ROS  Rhythm:Regular Rate:Normal     Neuro/Psych negative neurological ROS  negative psych ROS   GI/Hepatic negative GI ROS, Neg liver ROS,   Endo/Other  negative endocrine ROS  Renal/GU negative Renal ROS  negative genitourinary   Musculoskeletal negative musculoskeletal ROS (+)   Abdominal   Peds  Hematology negative hematology ROS (+)   Anesthesia Other Findings   Reproductive/Obstetrics (+) Pregnancy HSV                           Anesthesia Physical Anesthesia Plan  ASA: II  Anesthesia Plan: Epidural   Post-op Pain Management:    Induction:   Airway Management Planned: Natural Airway  Additional Equipment:   Intra-op Plan:   Post-operative Plan:   Informed Consent: I have reviewed the patients History and Physical, chart, labs and discussed the procedure including the risks, benefits and alternatives for the proposed anesthesia with the patient or authorized representative who has indicated his/her understanding and acceptance.     Plan Discussed with: Anesthesiologist  Anesthesia Plan Comments:         Anesthesia Quick Evaluation

## 2013-12-05 NOTE — Progress Notes (Signed)
Interval Note:  S: Feels well . Pain controlled w/Motrin  O: VSS  A: S/p SVD  P: PPD #0     Continue routine postpartum care.

## 2013-12-06 LAB — CBC
HCT: 35.9 % — ABNORMAL LOW (ref 36.0–46.0)
Hemoglobin: 12.1 g/dL (ref 12.0–15.0)
MCH: 32 pg (ref 26.0–34.0)
MCHC: 33.7 g/dL (ref 30.0–36.0)
MCV: 95 fL (ref 78.0–100.0)
PLATELETS: 202 10*3/uL (ref 150–400)
RBC: 3.78 MIL/uL — ABNORMAL LOW (ref 3.87–5.11)
RDW: 13.7 % (ref 11.5–15.5)
WBC: 9.3 10*3/uL (ref 4.0–10.5)

## 2013-12-06 MED ORDER — IBUPROFEN 600 MG PO TABS: 600.0000 mg | ORAL_TABLET | Freq: Four times a day (QID) | ORAL | Status: DC

## 2013-12-06 MED ORDER — INFLUENZA VAC SPLIT QUAD 0.5 ML IM SUSY
0.5000 mL | PREFILLED_SYRINGE | INTRAMUSCULAR | Status: DC | PRN
  Filled 2013-12-06: qty 0.5

## 2013-12-06 NOTE — Progress Notes (Signed)
PPD 1 SVD  S:  Reports feeling well - desires DC today             Tolerating po/ No nausea or vomiting             Bleeding is light             Pain controlled with motrin             Up ad lib / ambulatory / voiding QS  Newborn breast feeding  O:               VS: BP 98/65  Pulse 71  Temp(Src) 97.4 F (36.3 C) (Oral)  Resp 18  Ht 5\' 10"  (1.778 m)  Wt 87.091 kg (192 lb)  BMI 27.55 kg/m2  SpO2 98%  Breastfeeding? Unknown   LABS:              Recent Labs  12/05/13 0140 12/06/13 0610  WBC 12.5* 9.3  HGB 12.8 12.1  PLT 231 202               Blood type: --/--/A POS, A POS (10/05 0140)  Rubella: Immune (10/05 0000)                                Physical Exam:             Alert and oriented X3  Abdomen: soft, non-tender, non-distended              Fundus: firm, non-tender, U-1  Perineum: no edema  Lochia: light  Extremities: trace pedal edema, no calf pain or tenderness  A: PPD # 1   Doing well - stable status  P: Routine post partum orders  DC home  Marlinda MikeBAILEY, [name] CNM, MSN, [facility]ACNM 12/06/2013, 10:56 AM

## 2013-12-06 NOTE — Discharge Summary (Signed)
Obstetric Discharge Summary  Reason for Admission: onset of labor Prenatal Procedures: none Intrapartum Procedures: spontaneous vaginal delivery Postpartum Procedures: none Complications-Operative and Postpartum: none Hemoglobin  Date Value Ref Range Status  12/06/2013 12.1  12.0 - 15.0 g/dL Final     HCT  Date Value Ref Range Status  12/06/2013 35.9* 36.0 - 46.0 % Final    Physical Exam:  General: alert, cooperative and no distress Lochia: appropriate Uterine Fundus: firm Incision: healing well DVT Evaluation: No evidence of DVT seen on physical exam.  Discharge Diagnoses: Term Pregnancy-delivered  Discharge Information: Date: 12/06/2013 Activity: pelvic rest Diet: routine Medications: PNV and Ibuprofen Condition: stable Instructions: refer to practice specific booklet Discharge to: home Follow-up Information   [facility]. Schedule an appointment as soon as possible for a visit in 6 weeks.   Specialty:  Obstetrics and Gynecology   Contact information:   [address] [place] [address] [phone]       Newborn Data: Live born female  Birth Weight: 8 lb 0.9 oz (3655 g) APGAR: 9, 9  Home with mother.  Debra Fox, Debra Fox 12/06/2013, 11:24 AM

## 2013-12-06 NOTE — Lactation Note (Signed)
This note was copied from the chart of Debra Fox. Lactation Consultation Note  Patient Name: Debra Fox Reason for consult: Initial assessment Mom reports baby is nursing well. Mom latched baby independently, baby demonstrated a good rhythmic suck with swallows noted. Basic teaching reviewed. Lactation brochure left for review, advised of OP services and support group. Advised to call for questions or concerns.   Maternal Data Formula Feeding for Exclusion: No Has patient been taught Hand Expression?: Yes Does the patient have breastfeeding experience prior to this delivery?: Yes  Feeding Feeding Type: Breast Fed Length of feed: 10 min  LATCH Score/Interventions Latch: Grasps breast easily, tongue down, lips flanged, rhythmical sucking.  Audible Swallowing: Spontaneous and intermittent  Type of Nipple: Everted at rest and after stimulation  Comfort (Breast/Nipple): Soft / non-tender     Hold (Positioning): No assistance needed to correctly position infant at breast. Intervention(s): Breastfeeding basics reviewed;Support Pillows;Position options;Skin to skin  LATCH Score: 10  Lactation Tools Discussed/Used WIC Program: No   Consult Status Consult Status: Complete    Alfred LevinsGranger, Vidal Lampkins Ann Fox, 9:01 AM

## 2014-01-02 ENCOUNTER — Encounter (HOSPITAL_COMMUNITY): Payer: Self-pay | Admitting: *Deleted

## 2014-04-27 ENCOUNTER — Encounter (HOSPITAL_COMMUNITY): Payer: Self-pay | Admitting: *Deleted

## 2014-04-27 ENCOUNTER — Emergency Department (HOSPITAL_COMMUNITY): Payer: 59

## 2014-04-27 ENCOUNTER — Emergency Department (HOSPITAL_COMMUNITY)
Admission: EM | Admit: 2014-04-27 | Discharge: 2014-04-27 | Disposition: A | Discharge status: Home / Self Care | Payer: 59 | Attending: Emergency Medicine | Admitting: Emergency Medicine

## 2014-04-27 DIAGNOSIS — Z791 Long term (current) use of non-steroidal anti-inflammatories (NSAID): Secondary | ICD-10-CM | POA: Diagnosis not present

## 2014-04-27 DIAGNOSIS — Y9339 Activity, other involving climbing, rappelling and jumping off: Secondary | ICD-10-CM | POA: Diagnosis not present

## 2014-04-27 DIAGNOSIS — Z8619 Personal history of other infectious and parasitic diseases: Secondary | ICD-10-CM | POA: Insufficient documentation

## 2014-04-27 DIAGNOSIS — Y998 Other external cause status: Secondary | ICD-10-CM | POA: Insufficient documentation

## 2014-04-27 DIAGNOSIS — X58XXXA Exposure to other specified factors, initial encounter: Secondary | ICD-10-CM | POA: Diagnosis not present

## 2014-04-27 DIAGNOSIS — S83241A Other tear of medial meniscus, current injury, right knee, initial encounter: Secondary | ICD-10-CM | POA: Diagnosis not present

## 2014-04-27 DIAGNOSIS — Y9289 Other specified places as the place of occurrence of the external cause: Secondary | ICD-10-CM | POA: Insufficient documentation

## 2014-04-27 DIAGNOSIS — S8991XA Unspecified injury of right lower leg, initial encounter: Secondary | ICD-10-CM | POA: Diagnosis present

## 2014-04-27 DIAGNOSIS — S838X1A Sprain of other specified parts of right knee, initial encounter: Secondary | ICD-10-CM

## 2014-04-27 MED ORDER — DICLOFENAC SODIUM 1 % TD GEL: 4.0000 g | Freq: Four times a day (QID) | TRANSDERMAL | Status: DC

## 2014-04-27 MED ORDER — NAPROXEN 500 MG PO TABS
500.0000 mg | ORAL_TABLET | Freq: Once | ORAL | Status: AC
  Administered 2014-04-27: 500 mg via ORAL
  Filled 2014-04-27: qty 1

## 2014-04-27 NOTE — ED Provider Notes (Signed)
CSN: [id number]     Arrival date & time 04/27/14  2114 History  This chart was scribed for non-physician practitioner, Antony Madura, PA-C, working with Gwyneth Sprout, MD, by Roxy Cedar ED Scribe. This patient was seen in room WTR5/WTR5 and the patient's care was started at 9:42 PM    Chief Complaint  Patient presents with  . Knee Injury   The history is provided by the patient. No language interpreter was used.   HPI Comments: Debra Fox is a 24 y.o. female with PMHx of HSV-1 infection, who presents to the Emergency Department complaining of moderate right knee pain due to rolling her knee to the side after landing from a squat jump while at the gym earlier today. She reports associated pain with movement, flexion/extension. She reports prior injury to right knee while skiing a few years ago. She states that she injured the same spot to where she has had a prior injury to. She states that she can usually straighten her leg out to relieve pain, but states that she is not able to do that today. She reports associated tingling in her right leg below the knee. She is not able to bear weight or walk with the injured leg. She reports mild decreased sensation to right foot.   Past Medical History  Diagnosis Date  . HSV-1 infection     Rare outbreaks   Past Surgical History  Procedure Laterality Date  . No past surgeries     Family History  Problem Relation Age of Onset  . Anesthesia problems Neg Hx   . Hypotension Neg Hx   . Malignant hyperthermia Neg Hx   . Pseudochol deficiency Neg Hx    History  Substance Use Topics  . Smoking status: Never Smoker   . Smokeless tobacco: Never Used  . Alcohol Use: Yes   OB History    Gravida Para Term Preterm AB TAB SAB Ectopic Multiple Living   Review of Systems  Musculoskeletal: Positive for arthralgias.  All other systems reviewed and are negative.   Allergies  Review of patient's allergies indicates no known  allergies.  Home Medications   Prior to Admission medications   Medication Sig Start Date End Date Taking? Authorizing Provider  diclofenac sodium (VOLTAREN) 1 % GEL Apply 4 g topically 4 (four) times daily. 04/27/14   Antony Madura, PA-C  ibuprofen (ADVIL,MOTRIN) 600 MG tablet Take 1 tablet (600 mg total) by mouth every 6 (six) hours. Patient not taking: Reported on 04/27/2014 12/06/13   Marlinda Mike, CNM   Triage Vitals: BP 122/63 mmHg  Pulse 77  Temp(Src) 98.6 F (37 C) (Oral)  Resp 18  SpO2 98%  LMP 04/27/2014  Physical Exam  Constitutional: She is oriented to person, place, and time. She appears well-developed and well-nourished. No distress.  Nontoxic/nonseptic appearing  HENT:  Head: Normocephalic and atraumatic.  Eyes: Conjunctivae and EOM are normal. No scleral icterus.  Neck: Normal range of motion.  Cardiovascular: Normal rate, regular rhythm and intact distal pulses.   DP and PT pulses 2+ in the RLE  Pulmonary/Chest: Effort normal. No respiratory distress.  Musculoskeletal: She exhibits tenderness.       Right knee: She exhibits decreased range of motion (mild decreased PROM with knee extension; decreased AROM secondary to pain). She exhibits no swelling, no effusion, no deformity, no erythema, normal alignment, no LCL laxity, normal patellar mobility, no bony tenderness  and no MCL laxity. Tenderness found. Medial joint line tenderness noted. No patellar tendon tenderness noted.       Right upper leg: Normal.       Right lower leg: Normal.  TTP along the medial joint line, more anteriorly. No TTP along the course of the MCL. No crepitus or deformity.  Neurological: She is alert and oriented to person, place, and time. She exhibits normal muscle tone. Coordination normal.  Sensation to light touch intact. Patellar and achilles reflexes 2+ in the RLE  Skin: Skin is warm and dry. No rash noted. She is not diaphoretic. No erythema. No pallor.  Psychiatric: She has a normal mood  and affect. Her behavior is normal.  Nursing note and vitals reviewed.   ED Course  Procedures (including critical care time)  DIAGNOSTIC STUDIES: Oxygen Saturation is 98% on RA, normal by my interpretation.    COORDINATION OF CARE: 9:47 PM- Discussed plans to order diagnostic imaging of right knee. Will give patient aleve for pain management. Pt advised of plan for treatment and pt agrees.  Labs Review Labs Reviewed - No data to display  Imaging Review Dg Knee Complete 4 Views Right  04/27/2014   CLINICAL DATA:  Injury at 6 p.m. RIGHT knee pain. Medial RIGHT knee pain. Patient unable to fully extend the RIGHT knee.  EXAM: RIGHT KNEE - COMPLETE 4+ VIEW  COMPARISON:  None.  FINDINGS: There is no evidence of fracture, dislocation, or joint effusion. There is no evidence of arthropathy or other focal bone abnormality. Soft tissues are unremarkable.  IMPRESSION: Negative.   Electronically Signed   By: Andreas NewportGeoffrey  Lamke M.D.   On: 04/27/2014 22:05     EKG Interpretation None     MDM   Final diagnoses:  Acute medial meniscal injury of knee, right, initial encounter    24 year old female brought to the emergency department for further evaluation of right knee pain which occurred after performing jump squats at the gym. Patient is neurovascularly intact. No evidence of septic joint. Physical exam findings and negative x-ray consistent with high suspicion for medial meniscal injury. Patient given knee sleeve and crutches for WBAT. Will prescribe Voltaren gel for pain and swelling. RICE advised and orthopedic referral provided. Return precautions given. Patient agreeable to plan with no unaddressed concerns. Patient discharged in good condition.  I personally performed the services described in this documentation, which was scribed in my presence. The recorded information has been reviewed and is accurate.   Filed Vitals:   04/27/14 2138  BP: 122/63  Pulse: 77  Temp: 98.6 F (37 C)   TempSrc: Oral  Resp: 18  SpO2: 98%     Antony MaduraKelly Merion Grimaldo, PA-C 04/27/14 2225  Gwyneth SproutWhitney Plunkett, MD 04/27/14 2344

## 2014-04-27 NOTE — ED Notes (Signed)
Pt states she injured her right knee while performing a jumping exercise at the gym today. Pt states she rolled her knee to the side when she landed from a jump and heard a pop. Pt cannot bear weight or walk with the injured leg.

## 2014-04-27 NOTE — Discharge Instructions (Signed)
Knee, Cartilage (Meniscus) Injury It is suspected that you have a torn cartilage (meniscus) in your knee. The menisci are made of tough cartilage and fit between the surfaces of the thigh and leg bones. The menisci are C-shaped and have a wedged profile. The wedged profile helps the stability of the joint by keeping the rounded femur surface from sliding off the flat tibial surface. The menisci are fed (nourished) by small blood vessels, but there is also a large area at the inner edge of the meniscus that does not have a good blood supply (avascular). This presents a problem when there is an injury to the meniscus because areas without good blood supply heal poorly. As a result when there is a torn cartilage in the knee, surgery is often required to fix it. This is usually done with a surgical procedure less invasive than open surgery (arthroscopy). Some times open surgery of the knee is required if there is other damage. PURPOSE OF THE MENISCUS The medial meniscus rests on the medial tibial plateau. The tibia is the large bone in your lower leg (the shin bone). The medial tibial plateau is the upper end of the bone making up the inner part of your knee. The lateral meniscus serves the same purpose and is located on the outside of the knee. The menisci help to distribute your body weight across the knee joint; they act as shock absorbers. Without the meniscus present, the weight of your body would be unevenly applied to the bones in your legs (the femur and tibia). The femur is the large bone in your thigh. This uneven weight distribution would cause increased wear and tear on the cartilage lining the joint surfaces, leading to early damage (arthritis) of these areas. The presence of the menisci cartilage is necessary for a healthy knee. PURPOSE OF THE KNEE CARTILAGE The knee joint is made up of three bones: the thigh bone (femur), the shin bone (tibia), and the knee cap (patella). The surfaces of these bones  at the knee joint are covered with cartilage called articular cartilage. This smooth, slippery surface allows the bones to slide against each other without causing bone damage. The meniscus sits between these cartilaginous surfaces of the bones. It distributes the weight evenly in the joints and helps with the stability of the joint (keeps the joint steady). HOME CARE INSTRUCTIONS  Use crutches and external braces as instructed.  Once home, an ice pack applied to your injured knee may help with discomfort and keep the swelling down. An ice pack can be used for the first couple of days or as instructed.  Only take over-the-counter or prescription medicines for pain, discomfort, or fever as directed by your caregiver.  Call if you do not have relief of pain with medications or if there is increasing in pain.  Call if your foot becomes cold or blue.  You may resume normal diet and activities as directed.  Make sure to keep your appointments with your follow-up caregiver. This injury may require further evaluation and treatment beyond the temporary treatment given today. Document Released: 05/10/2002 Document Revised: 07/04/2013 Document Reviewed: 09/01/2008 [facility] Patient Information 2015 [place], [place]. This information is not intended to replace advice given to you by your health care provider. Make sure you discuss any questions you have with your health care provider.  RICE: Routine Care for Injuries The routine care of many injuries includes Rest, Ice, Compression, and Elevation (RICE). HOME CARE INSTRUCTIONS  Rest is needed to allow  your body to heal. Routine activities can usually be resumed when comfortable. Injured tendons and bones can take up to 6 weeks to heal. Tendons are the cord-like structures that attach muscle to bone.  Ice following an injury helps keep the swelling down and reduces pain.  Put ice in a plastic bag.  Place a towel between your skin and the bag.  Leave  the ice on for 15-20 minutes, 3-4 times a day, or as directed by your health care provider. Do this while awake, for the first 24 to 48 hours. After that, continue as directed by your caregiver.  Compression helps keep swelling down. It also gives support and helps with discomfort. If an elastic bandage has been applied, it should be removed and reapplied every 3 to 4 hours. It should not be applied tightly, but firmly enough to keep swelling down. Watch fingers or toes for swelling, bluish discoloration, coldness, numbness, or excessive pain. If any of these problems occur, remove the bandage and reapply loosely. Contact your caregiver if these problems continue.  Elevation helps reduce swelling and decreases pain. With extremities, such as the arms, hands, legs, and feet, the injured area should be placed near or above the level of the heart, if possible. SEEK IMMEDIATE MEDICAL CARE IF:  You have persistent pain and swelling.  You develop redness, numbness, or unexpected weakness.  Your symptoms are getting worse rather than improving after several days. These symptoms may indicate that further evaluation or further X-rays are needed. Sometimes, X-rays may not show a small broken bone (fracture) until 1 week or 10 days later. Make a follow-up appointment with your caregiver. Ask when your X-ray results will be ready. Make sure you get your X-ray results. Document Released: 06/01/2000 Document Revised: 02/22/2013 Document Reviewed: 07/19/2010 [facility] Patient Information 2015 [place], [place]. This information is not intended to replace advice given to you by your health care provider. Make sure you discuss any questions you have with your health care provider.

## 2014-05-02 DIAGNOSIS — M23321 Other meniscus derangements, posterior horn of medial meniscus, right knee: Secondary | ICD-10-CM

## 2014-05-02 DIAGNOSIS — S83519A Sprain of anterior cruciate ligament of unspecified knee, initial encounter: Secondary | ICD-10-CM

## 2014-05-02 HISTORY — DX: Other meniscus derangements, posterior horn of medial meniscus, right knee: M23.321

## 2014-05-02 HISTORY — DX: Sprain of anterior cruciate ligament of unspecified knee, initial encounter: S83.519A

## 2014-05-10 ENCOUNTER — Other Ambulatory Visit: Payer: Self-pay | Admitting: Orthopedic Surgery

## 2014-05-15 ENCOUNTER — Encounter (HOSPITAL_BASED_OUTPATIENT_CLINIC_OR_DEPARTMENT_OTHER): Payer: Self-pay | Admitting: *Deleted

## 2014-05-19 ENCOUNTER — Encounter (HOSPITAL_BASED_OUTPATIENT_CLINIC_OR_DEPARTMENT_OTHER)
Admission: RE | Disposition: A | Discharge status: Home / Self Care | Payer: Self-pay | Source: Ambulatory Visit | Attending: Orthopedic Surgery

## 2014-05-19 ENCOUNTER — Encounter (HOSPITAL_BASED_OUTPATIENT_CLINIC_OR_DEPARTMENT_OTHER): Payer: Self-pay | Admitting: Anesthesiology

## 2014-05-19 ENCOUNTER — Ambulatory Visit (HOSPITAL_BASED_OUTPATIENT_CLINIC_OR_DEPARTMENT_OTHER): Payer: 59 | Admitting: Anesthesiology

## 2014-05-19 ENCOUNTER — Ambulatory Visit (HOSPITAL_BASED_OUTPATIENT_CLINIC_OR_DEPARTMENT_OTHER)
Admission: RE | Admit: 2014-05-19 | Discharge: 2014-05-19 | Disposition: A | Discharge status: Home / Self Care | Payer: 59 | Source: Ambulatory Visit | Attending: Orthopedic Surgery | Admitting: Orthopedic Surgery

## 2014-05-19 DIAGNOSIS — X58XXXA Exposure to other specified factors, initial encounter: Secondary | ICD-10-CM | POA: Diagnosis not present

## 2014-05-19 DIAGNOSIS — S83511A Sprain of anterior cruciate ligament of right knee, initial encounter: Secondary | ICD-10-CM | POA: Diagnosis present

## 2014-05-19 DIAGNOSIS — S83211A Bucket-handle tear of medial meniscus, current injury, right knee, initial encounter: Secondary | ICD-10-CM | POA: Diagnosis present

## 2014-05-19 DIAGNOSIS — Y93A2 Activity, calisthenics: Secondary | ICD-10-CM | POA: Insufficient documentation

## 2014-05-19 HISTORY — PX: MENISCUS REPAIR: SHX5179

## 2014-05-19 HISTORY — DX: Other meniscus derangements, posterior horn of medial meniscus, right knee: M23.321

## 2014-05-19 HISTORY — DX: Bucket-handle tear of medial meniscus, current injury, right knee, initial encounter: S83.211A

## 2014-05-19 HISTORY — DX: Sprain of anterior cruciate ligament of right knee, initial encounter: S83.511A

## 2014-05-19 HISTORY — PX: ARTHROSCOPY WITH ANTERIOR CRUCIATE LIGAMENT (ACL) REPAIR WITH ANTERIOR TIBILIAS GRAFT: SHX6503

## 2014-05-19 HISTORY — DX: Sprain of anterior cruciate ligament of unspecified knee, initial encounter: S83.519A

## 2014-05-19 LAB — POCT HEMOGLOBIN-HEMACUE: HEMOGLOBIN: 14.5 g/dL (ref 12.0–15.0)

## 2014-05-19 SURGERY — KNEE ARTHROSCOPY WITH ANTERIOR CRUCIATE LIGAMENT (ACL) REPAIR WITH ANTERIOR TIBILIAS GRAFT
Anesthesia: Regional | Site: Knee | Laterality: Right

## 2014-05-19 MED ORDER — MEPERIDINE HCL 25 MG/ML IJ SOLN: 6.2500 mg | INTRAMUSCULAR | Status: DC | PRN

## 2014-05-19 MED ORDER — HYDROMORPHONE HCL 1 MG/ML IJ SOLN
INTRAMUSCULAR | Status: AC
  Filled 2014-05-19: qty 1

## 2014-05-19 MED ORDER — FENTANYL CITRATE 0.05 MG/ML IJ SOLN
INTRAMUSCULAR | Status: DC | PRN
  Administered 2014-05-19: 25 ug via INTRAVENOUS
  Administered 2014-05-19: 50 ug via INTRAVENOUS
  Administered 2014-05-19 (×2): 25 ug via INTRAVENOUS
  Administered 2014-05-19: 50 ug via INTRAVENOUS
  Administered 2014-05-19: 25 ug via INTRAVENOUS

## 2014-05-19 MED ORDER — BACLOFEN 10 MG PO TABS: 10.0000 mg | ORAL_TABLET | Freq: Three times a day (TID) | ORAL | Status: DC

## 2014-05-19 MED ORDER — ROPIVACAINE HCL 5 MG/ML IJ SOLN
INTRAMUSCULAR | Status: DC | PRN
  Administered 2014-05-19: 12 mL via PERINEURAL

## 2014-05-19 MED ORDER — LACTATED RINGERS IV SOLN
INTRAVENOUS | Status: DC
  Administered 2014-05-19 (×2): via INTRAVENOUS

## 2014-05-19 MED ORDER — ONDANSETRON HCL 4 MG/2ML IJ SOLN: 4.0000 mg | Freq: Once | INTRAMUSCULAR | Status: DC | PRN

## 2014-05-19 MED ORDER — OXYCODONE HCL 5 MG PO TABS: 5.0000 mg | ORAL_TABLET | Freq: Once | ORAL | Status: DC | PRN

## 2014-05-19 MED ORDER — CEFAZOLIN SODIUM-DEXTROSE 2-3 GM-% IV SOLR
2.0000 g | INTRAVENOUS | Status: AC
  Administered 2014-05-19: 2 g via INTRAVENOUS

## 2014-05-19 MED ORDER — MIDAZOLAM HCL 2 MG/2ML IJ SOLN
INTRAMUSCULAR | Status: AC
  Filled 2014-05-19: qty 2

## 2014-05-19 MED ORDER — OXYCODONE HCL 5 MG/5ML PO SOLN: 5.0000 mg | Freq: Once | ORAL | Status: DC | PRN

## 2014-05-19 MED ORDER — OXYCODONE-ACETAMINOPHEN 5-325 MG PO TABS: 1.0000 | ORAL_TABLET | Freq: Four times a day (QID) | ORAL | Status: DC | PRN

## 2014-05-19 MED ORDER — PROMETHAZINE HCL 25 MG/ML IJ SOLN
6.2500 mg | Freq: Once | INTRAMUSCULAR | Status: AC
  Administered 2014-05-19: 6.25 mg via INTRAVENOUS

## 2014-05-19 MED ORDER — MIDAZOLAM HCL 2 MG/2ML IJ SOLN
1.0000 mg | INTRAMUSCULAR | Status: DC | PRN
  Administered 2014-05-19: 2 mg via INTRAVENOUS

## 2014-05-19 MED ORDER — PROMETHAZINE HCL 25 MG/ML IJ SOLN
INTRAMUSCULAR | Status: AC
  Filled 2014-05-19: qty 1

## 2014-05-19 MED ORDER — BUPIVACAINE HCL (PF) 0.5 % IJ SOLN
INTRAMUSCULAR | Status: AC
  Filled 2014-05-19: qty 30

## 2014-05-19 MED ORDER — CEFAZOLIN SODIUM-DEXTROSE 2-3 GM-% IV SOLR
INTRAVENOUS | Status: AC
  Filled 2014-05-19: qty 50

## 2014-05-19 MED ORDER — FENTANYL CITRATE 0.05 MG/ML IJ SOLN
50.0000 ug | INTRAMUSCULAR | Status: DC | PRN
  Administered 2014-05-19: 100 ug via INTRAVENOUS

## 2014-05-19 MED ORDER — MIDAZOLAM HCL 2 MG/ML PO SYRP: 12.0000 mg | ORAL_SOLUTION | Freq: Once | ORAL | Status: DC | PRN

## 2014-05-19 MED ORDER — HYDROMORPHONE HCL 1 MG/ML IJ SOLN
0.2500 mg | INTRAMUSCULAR | Status: DC | PRN
  Administered 2014-05-19 (×2): 0.25 mg via INTRAVENOUS
  Administered 2014-05-19: 0.5 mg via INTRAVENOUS

## 2014-05-19 MED ORDER — BUPIVACAINE HCL (PF) 0.25 % IJ SOLN
INTRAMUSCULAR | Status: AC
  Filled 2014-05-19: qty 30

## 2014-05-19 MED ORDER — SODIUM CHLORIDE 0.9 % IR SOLN
Status: DC | PRN
  Administered 2014-05-19: 12000 mL

## 2014-05-19 MED ORDER — FENTANYL CITRATE 0.05 MG/ML IJ SOLN
INTRAMUSCULAR | Status: AC
  Filled 2014-05-19: qty 4

## 2014-05-19 MED ORDER — ONDANSETRON HCL 4 MG PO TABS: 4.0000 mg | ORAL_TABLET | Freq: Three times a day (TID) | ORAL | Status: DC | PRN

## 2014-05-19 MED ORDER — MIDAZOLAM HCL 5 MG/5ML IJ SOLN
INTRAMUSCULAR | Status: DC | PRN
  Administered 2014-05-19: 2 mg via INTRAVENOUS

## 2014-05-19 MED ORDER — DEXAMETHASONE SODIUM PHOSPHATE 4 MG/ML IJ SOLN
INTRAMUSCULAR | Status: DC | PRN
  Administered 2014-05-19: 10 mg via INTRAVENOUS

## 2014-05-19 MED ORDER — PROPOFOL 10 MG/ML IV BOLUS
INTRAVENOUS | Status: DC | PRN
  Administered 2014-05-19 (×5): 30 mg via INTRAVENOUS
  Administered 2014-05-19: 200 mg via INTRAVENOUS

## 2014-05-19 MED ORDER — SENNA-DOCUSATE SODIUM 8.6-50 MG PO TABS
2.0000 | ORAL_TABLET | Freq: Every day | ORAL | Status: DC
Start: 2014-05-19 — End: 2014-09-18

## 2014-05-19 MED ORDER — LIDOCAINE HCL (CARDIAC) 20 MG/ML IV SOLN
INTRAVENOUS | Status: DC | PRN
  Administered 2014-05-19: 50 mg via INTRAVENOUS

## 2014-05-19 MED ORDER — FENTANYL CITRATE 0.05 MG/ML IJ SOLN
INTRAMUSCULAR | Status: AC
  Filled 2014-05-19: qty 2

## 2014-05-19 SURGICAL SUPPLY — 84 items
ANCHOR BUTTON TIGHTROPE ACL RT (Orthopedic Implant) ×1 IMPLANT
BANDAGE ELASTIC 6 VELCRO ST LF (GAUZE/BANDAGES/DRESSINGS) IMPLANT
BANDAGE ESMARK 6X9 LF (GAUZE/BANDAGES/DRESSINGS) IMPLANT
BLADE 4.2CUDA (BLADE) IMPLANT
BLADE CUDA GRT WHITE 3.5 (BLADE) ×3 IMPLANT
BLADE CUDA SHAVER 3.5 (BLADE) IMPLANT
BLADE CUTTER GATOR 3.5 (BLADE) IMPLANT
BLADE GREAT WHITE 4.2 (BLADE) IMPLANT
BLADE SURG 15 STRL LF DISP TIS (BLADE) ×2 IMPLANT
BLADE SURG 15 STRL SS (BLADE) ×3
BNDG CMPR 9X6 STRL LF SNTH (GAUZE/BANDAGES/DRESSINGS)
BNDG ESMARK 6X9 LF (GAUZE/BANDAGES/DRESSINGS)
BUR OVAL 6.0 (BURR) ×1 IMPLANT
CINCH MENISCAL (Anchor) IMPLANT
CLSR STERI-STRIP ANTIMIC 1/2X4 (GAUZE/BANDAGES/DRESSINGS) ×3 IMPLANT
COVER BACK TABLE 60X90IN (DRAPES) ×3 IMPLANT
CUFF TOURNIQUET SINGLE 34IN LL (TOURNIQUET CUFF) ×1 IMPLANT
CUTTER KNOT PUSHER 2-0 FIBERWI (INSTRUMENTS) ×1 IMPLANT
CUTTER MENISCUS  4.2MM (BLADE)
CUTTER MENISCUS 4.2MM (BLADE) IMPLANT
DECANTER SPIKE VIAL GLASS SM (MISCELLANEOUS) IMPLANT
DRAPE ARTHROSCOPY W/POUCH 114 (DRAPES) ×3 IMPLANT
DRAPE OEC MINIVIEW 54X84 (DRAPES) ×3 IMPLANT
DRAPE U 20/CS (DRAPES) ×6 IMPLANT
DRAPE U-SHAPE 47X51 STRL (DRAPES) ×3 IMPLANT
DRILL FLIPCUTTER II 10MM (CUTTER) IMPLANT
DURAPREP 26ML APPLICATOR (WOUND CARE) ×3 IMPLANT
ELECT REM PT RETURN 9FT ADLT (ELECTROSURGICAL) ×3
ELECTRODE REM PT RTRN 9FT ADLT (ELECTROSURGICAL) ×2 IMPLANT
FIBERSTICK 2 (SUTURE) ×3 IMPLANT
FLIPCUTTER II 10MM (CUTTER) ×3
GAUZE SPONGE 4X4 12PLY STRL (GAUZE/BANDAGES/DRESSINGS) ×3 IMPLANT
GLOVE BIO SURGEON STRL SZ8 (GLOVE) ×3 IMPLANT
GLOVE BIOGEL PI IND STRL 7.0 (GLOVE) IMPLANT
GLOVE BIOGEL PI IND STRL 8 (GLOVE) ×6 IMPLANT
GLOVE BIOGEL PI INDICATOR 7.0 (GLOVE) ×1
GLOVE BIOGEL PI INDICATOR 8 (GLOVE) ×3
GLOVE ECLIPSE 6.5 STRL STRAW (GLOVE) ×1 IMPLANT
GLOVE ORTHO TXT STRL SZ7.5 (GLOVE) ×3 IMPLANT
GOWN STRL REUS W/ TWL LRG LVL3 (GOWN DISPOSABLE) ×2 IMPLANT
GOWN STRL REUS W/ TWL XL LVL3 (GOWN DISPOSABLE) ×4 IMPLANT
GOWN STRL REUS W/TWL LRG LVL3 (GOWN DISPOSABLE) ×3
GOWN STRL REUS W/TWL XL LVL3 (GOWN DISPOSABLE) ×6
GRAFT TISS ANT TIB TNDN (Tissue) IMPLANT
HOLDER KNEE FOAM BLUE (MISCELLANEOUS) ×2 IMPLANT
IMMOBILIZER KNEE 22 UNIV (SOFTGOODS) ×3 IMPLANT
IMMOBILIZER KNEE 24 THIGH 36 (MISCELLANEOUS) ×2 IMPLANT
IMMOBILIZER KNEE 24 UNIV (MISCELLANEOUS) ×3
IV NS IRRIG 3000ML ARTHROMATIC (IV SOLUTION) ×6 IMPLANT
KIT TRANSTIBIAL (DISPOSABLE) IMPLANT
KNEE WRAP E Z 3 GEL PACK (MISCELLANEOUS) ×3 IMPLANT
MANIFOLD NEPTUNE II (INSTRUMENTS) ×3 IMPLANT
MENISCAL CINCH (Anchor) ×6 IMPLANT
NS IRRIG 1000ML POUR BTL (IV SOLUTION) ×3 IMPLANT
PACK ARTHROSCOPY DSU (CUSTOM PROCEDURE TRAY) ×3 IMPLANT
PACK BASIN DAY SURGERY FS (CUSTOM PROCEDURE TRAY) ×3 IMPLANT
PAD CAST 4YDX4 CTTN HI CHSV (CAST SUPPLIES) ×2 IMPLANT
PADDING CAST COTTON 4X4 STRL (CAST SUPPLIES) ×3
PADDING CAST COTTON 6X4 STRL (CAST SUPPLIES) ×3 IMPLANT
PENCIL BUTTON HOLSTER BLD 10FT (ELECTRODE) IMPLANT
SCREW BIO FULL THREADED 10X28 (Screw) ×1 IMPLANT
SET ARTHROSCOPY TUBING (MISCELLANEOUS) ×3
SET ARTHROSCOPY TUBING LN (MISCELLANEOUS) ×2 IMPLANT
SLEEVE SCD COMPRESS KNEE MED (MISCELLANEOUS) ×3 IMPLANT
SPONGE LAP 4X18 X RAY DECT (DISPOSABLE) ×3 IMPLANT
SUCTION FRAZIER TIP 10 FR DISP (SUCTIONS) IMPLANT
SUT 2 FIBERLOOP 20 STRT BLUE (SUTURE) ×6
SUT FIBERWIRE #2 38 T-5 BLUE (SUTURE)
SUT MNCRL AB 4-0 PS2 18 (SUTURE) IMPLANT
SUT VIC AB 0 CT1 27 (SUTURE)
SUT VIC AB 0 CT1 27XBRD ANBCTR (SUTURE) IMPLANT
SUT VIC AB 2-0 SH 27 (SUTURE)
SUT VIC AB 2-0 SH 27XBRD (SUTURE) IMPLANT
SUT VIC AB 3-0 SH 27 (SUTURE)
SUT VIC AB 3-0 SH 27X BRD (SUTURE) IMPLANT
SUT VICRYL 3-0 CR8 SH (SUTURE) IMPLANT
SUT VICRYL 4-0 PS2 18IN ABS (SUTURE) IMPLANT
SUTURE 2 FIBERLOOP 20 STRT BLU (SUTURE) ×4 IMPLANT
SUTURE FIBERWR #2 38 T-5 BLUE (SUTURE) ×4 IMPLANT
TENDON ANTERIOR TIBIALIS (Tissue) ×3 IMPLANT
TOWEL OR 17X24 6PK STRL BLUE (TOWEL DISPOSABLE) ×3 IMPLANT
TOWEL OR NON WOVEN STRL DISP B (DISPOSABLE) ×3 IMPLANT
WAND STAR VAC 90 (SURGICAL WAND) ×3 IMPLANT
WATER STERILE IRR 1000ML POUR (IV SOLUTION) ×3 IMPLANT

## 2014-05-19 NOTE — Anesthesia Procedure Notes (Addendum)
Anesthesia Regional Block:  Adductor canal block  Pre-Anesthetic Checklist: ,, timeout performed, Correct Patient, Correct Site, Correct Laterality, Correct Procedure, Correct Position, site marked, Risks and benefits discussed,  Surgical consent,  Pre-op evaluation,  At surgeon's request and post-op pain management   Prep: chloraprep       Needles:  Injection technique: Single-shot  Needle Type: Echogenic Stimulator Needle     Needle Length: 9cm 9 cm Needle Gauge: 21 and 21 G    Additional Needles:  Procedures: ultrasound guided (picture in chart) Adductor canal block Narrative:  End time: 05/19/2014 10:51 AM Injection made incrementally with aspirations every 5 mL.  Performed by: Personally  Anesthesiologist: Milly JakobAVULAPATI, JANAKIRAM   Procedure Name: LMA Insertion Date/Time: 05/19/2014 11:23 AM Performed by: Dahlgren DesanctisLINKA, Gennaro Lizotte L Pre-anesthesia Checklist: Patient identified, Emergency Drugs available, Suction available, Patient being monitored and Timeout performed Patient Re-evaluated:Patient Re-evaluated prior to inductionOxygen Delivery Method: Circle System Utilized Preoxygenation: Pre-oxygenation with 100% oxygen Intubation Type: IV induction Ventilation: Mask ventilation without difficulty LMA: LMA inserted LMA Size: 4.0 Number of attempts: 1 Airway Equipment and Method: Bite block Placement Confirmation: positive ETCO2 Tube secured with: Tape Dental Injury: Teeth and Oropharynx as per pre-operative assessment

## 2014-05-19 NOTE — Op Note (Signed)
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  ANESTHESIA:   General  PREOPERATIVE INDICATIONS:  Debra Fox is a  24 y.o. female who tore her right anterior cruciate ligament about 6 years ago, skiing, but never sought medical treatment. She is doing box jumps, and tore her medial meniscus about a month ago, and had a locked medial meniscus tear. She elected for surgical management.  The risks benefits and alternatives were discussed with the patient preoperatively including but not limited to the risks of infection, bleeding, nerve injury, stiffness, cardiopulmonary complications, the need for revision surgery, recurrent instability, progression of arthritis, the potential for use of a allograft and related disease transmission risks, among others and the patient was willing to proceed.  . We also discussed the risks for recurrent meniscal tear, the need for revision surgery, among others.  OPERATIVE IMPLANTS: Arthrex anterior cruciate ligament tightrope, bio composite tibial interference screw size 10 x 28 mm with a size 10 tibialis anterior allograft and a total of 2 Arthrex meniscal cinch sutures for the medial meniscus.  OPERATIVE FINDINGS: The anterior cruciate ligament was completely torn. The PCL was intact. The posterior lateral corner was intact to dial testing. She was stable to varus and valgus stress testing. The medial meniscus had a bucket-handle tear, and this was effectively at the  capsular junction. There was a lot of good meniscus within the central portion of the meniscus. The lateral compartment was completely normal and the patellofemoral joint and medial cartilaginous surfaces were all normal.   OPERATIVE PROCEDURE: The patient was brought to the operating room and placed in the supine position. General anesthesia was administered. IV antibiotics were given. The lower extremity was prepped and draped in usual sterile fashion. Exam under anesthesia demonstrated the above-named findings. Time out was performed.  Knee arthroscopy was then performed, and the above named findings were noted.    I reduced the medial meniscus using a probe, and evaluated the integrity of the tissue and the tissue quality was quite good, and the tear was in the red red zone, and so I felt that particularly in conjunction with an anterior cruciate ligament tear her odds of healing her tear were good, and so I proceeded with a meniscus repair.  I placed a total of 2 meniscal cinch stitches, one from the medial portal and then switched portals for the more anterior stitch placing it from the lateral portal. Excellent reduction of the meniscus and tensioning was achieved.  I then removed the previous anterior cruciate ligament stump, and performed a mild notchplasty.  The outside in guide was then applied to the appropriate position and the retro-cutter was used to drill the femoral socket. Care was taken to maintain the cortical bridge. The 10 mm flip cutter was utilized.  I then drilled the tibial tunnel using the retro-cutter, and opened the cortex with a reamer. All the soft tissue remnants were removed and cleaned at the aperture of the tunnel.  I also dilated with the appropriate dilators. I opened the cortex with a  10 mm acorn reamer, and used a 9.5 and 10 mm dilator.  The passing suture was delivered through the tibia, and then the button and graft delivered up into the femoral tunnel.   The button was flipped and confirmed under live fluoroscopy. I then tensioned the anterior cruciate ligament tightrope, and deliver the graft up into the femoral tunnel. Over 25 mm of graft was in the femoral tunnel. I confirmed once more with the fluoroscopy that the button was flipped appropriately on the femoral cortex.  I then cycled the knee, eliminated all of the creep, and I had excellent isometry. I then applied tension, and the Arthrex bio composite interference screw into the tibia placing a reverse Lachman maneuver on the tibia and femur. I removed the guide pin prior to completely seating the screw.  Excellent fixation was achieved on both the femoral and tibial side, and the wounds were irrigated copiously and the sartorius fascia repaired with Vicryl, and the portals repaired with Monocryl with Steri-Strips and sterile gauze.  The patient was awakened and returned to PACU in stable and satisfactory condition. There were no complications and She tolerated the procedure well.

## 2014-05-19 NOTE — Anesthesia Postprocedure Evaluation (Signed)
  Anesthesia Post-op Note  Patient: Debra Fox  Procedure(s) Performed: Procedure(s): RIGHT KNEE ARTHROSCOPY MEDIAL MENISCECTOMY WITH ANTERIOR CRUCIATE LIGAMENT (ACL) TIBIAL ANTERIOR ALLOGRAFT, POSSIBLE MEDIAL MENISCUS REPAIR  (Right) REPAIR OF MENISCUS  Patient Location: PACU  Anesthesia Type:General and Regional  Level of Consciousness: awake, alert  and oriented  Airway and Oxygen Therapy: Patient Spontanous Breathing  Post-op Pain: none  Post-op Assessment: Post-op Vital signs reviewed, Patient's Cardiovascular Status Stable, Respiratory Function Stable, Patent Airway, No signs of Nausea or vomiting and Adequate PO intake  Post-op Vital Signs: Reviewed and stable  Last Vitals:  Filed Vitals:   05/19/14 1400  BP: 135/80  Pulse: 76  Temp:   Resp: 16    Complications: No apparent anesthesia complications

## 2014-05-19 NOTE — Anesthesia Preprocedure Evaluation (Signed)
Anesthesia Evaluation  Patient identified by MRN, date of birth, ID band Patient awake    Reviewed: Allergy & Precautions, NPO status , Patient's Chart, lab work & pertinent test results  Airway Mallampati: I  TM Distance: >3 FB Neck ROM: Full    Dental   Pulmonary  breath sounds clear to auscultation        Cardiovascular negative cardio ROS  Rhythm:Regular Rate:Normal     Neuro/Psych    GI/Hepatic negative GI ROS, Neg liver ROS,   Endo/Other    Renal/GU negative Renal ROS     Musculoskeletal negative musculoskeletal ROS (+)   Abdominal   Peds negative pediatric ROS (+)  Hematology negative hematology ROS (+)   Anesthesia Other Findings   Reproductive/Obstetrics                             Anesthesia Physical Anesthesia Plan  ASA: I  Anesthesia Plan: General and Regional   Post-op Pain Management:    Induction: Intravenous  Airway Management Planned: LMA  Additional Equipment:   Intra-op Plan:   Post-operative Plan: Extubation in OR  Informed Consent: I have reviewed the patients History and Physical, chart, labs and discussed the procedure including the risks, benefits and alternatives for the proposed anesthesia with the patient or authorized representative who has indicated his/her understanding and acceptance.   Dental advisory given  Plan Discussed with: Anesthesiologist, CRNA and Surgeon  Anesthesia Plan Comments:         Anesthesia Quick Evaluation

## 2014-05-19 NOTE — Progress Notes (Signed)
Assisted {Dr. Ravulapati with right, ultrasound guided, adductor canal block. Side rails up, monitors on throughout procedure. See vital signs in flow sheet. Tolerated Procedure well. 

## 2014-05-19 NOTE — Transfer of Care (Signed)
Immediate Anesthesia Transfer of Care Note  Patient: Debra Fox  Procedure(s) Performed: Procedure(s): RIGHT KNEE ARTHROSCOPY MEDIAL MENISCECTOMY WITH ANTERIOR CRUCIATE LIGAMENT (ACL) TIBIAL ANTERIOR ALLOGRAFT, POSSIBLE MEDIAL MENISCUS REPAIR  (Right) REPAIR OF MENISCUS  Patient Location: PACU  Anesthesia Type:GA combined with regional for post-op pain  Level of Consciousness: sedated  Airway & Oxygen Therapy: Patient Spontanous Breathing and Patient connected to face mask oxygen  Post-op Assessment: Report given to RN and Post -op Vital signs reviewed and stable  Post vital signs: Reviewed and stable  Last Vitals:  Filed Vitals:   05/19/14 1100  BP: 118/63  Pulse: 58  Temp:   Resp:     Complications: No apparent anesthesia complications

## 2014-05-19 NOTE — H&P (Signed)
PREOPERATIVE H&P  Chief Complaint: Right knee pain  HPI: Debra Fox is a 24 y.o. female who presents for preoperative history and physical with a diagnosis of right anterior cruciate ligament tear with meniscus tear. Symptoms are rated as moderate to severe, and have been worsening.  This is significantly impairing activities of daily living.  She has elected for surgical management. She initially had an injury to her knee when she was 24 years old while skiing, never had any treatment for it. On November 2 she was doing squat jumps at the gym, felt a pop, has been unable to straighten her leg ever since. She has been seen in the emergency room, and referred to us for management. 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 The knee continues to feel unstable and she is not happy with her level of function.  Past Medical History  Diagnosis Date  . Derangement of posterior horn of medial meniscus of right knee 05/2014  . ACL tear 05/2014    right   Past Surgical History  Procedure Laterality Date  . No past surgeries     History   Social History  . Marital Status: Married    Spouse Name: N/A  . Number of Children: N/A  . Years of Education: N/A   Social History Main Topics  . Smoking status: Never Smoker   . Smokeless tobacco: Never Used  . Alcohol Use: Yes     Comment: occasionally  . Drug Use: No  . Sexual Activity: Yes    Birth Control/ Protection: None   Other Topics Concern  . None   Social History Narrative   History reviewed. No pertinent family history. No Known Allergies Prior to Admission medications   Medication Sig Start Date End Date Taking? Authorizing Provider  ibuprofen (ADVIL,MOTRIN) 200 MG tablet Take 200 mg by mouth every 6 (six) hours as needed.   Yes Historical Provider, MD     Positive ROS: All other systems have been reviewed and were otherwise  negative with the exception of those mentioned in the HPI and as above.  Physical Exam: General: Alert, no acute distress Cardiovascular: No pedal edema Respiratory: No cyanosis, no use of accessory musculature GI: No organomegaly, abdomen is soft and non-tender Skin: No lesions in the area of chief complaint Neurologic: Sensation intact distally Psychiatric: Patient is competent for consent with normal mood and affect Lymphatic: No axillary or cervical lymphadenopathy  MUSCULOSKELETAL: Right knee range of motion is lacking 25 of extension, flexion goes to 110. Positive Lachman and positive medial joint line tenderness. She is stable to varus and valgus stress.  Assessment: Locked bucket handle right knee medial meniscus tear with anterior cruciate ligament tear  Plan: Plan for Procedure(s): Right knee arthroscopy with anterior cruciate ligament reconstruction and medial meniscal repair versus debridement  The risks benefits and alternatives were discussed with the patient including but not limited to the risks of nonoperative treatment, versus surgical intervention including infection, bleeding, nerve injury,  blood clots, cardiopulmonary complications, morbidity, mortality, among others, and they were willing to proceed. We've also discussed the risk for posttraumatic arthritis, recurrent meniscal tear, recurrent anterior cruciate ligament tear, among others. She has indicated that she wishes to proceed with a allograft.  Eulas PostLANDAU,Shalini Mair P, MD Cell [phone]   05/19/2014 10:58 AM

## 2014-05-19 NOTE — Discharge Instructions (Signed)
Diet: As you were doing prior to hospitalization   Shower:  May shower but keep the wounds dry, use an occlusive plastic wrap, NO SOAKING IN TUB.  If the bandage gets wet, change with a clean dry gauze.  Dressing:  You may change your dressing 3-5 days after surgery.  Then change the dressing daily with sterile gauze dressing.    There are sticky tapes (steri-strips) on your wounds and all the stitches are absorbable.  Leave the steri-strips in place when changing your dressings, they will peel off with time, usually 2-3 weeks.  Activity:  Increase activity slowly as tolerated, but follow the weight bearing instructions below.  No lifting or driving for 6 weeks.  Weight Bearing:   Non-weight bearing, stay in the immobilizer at all times except dressing..    To prevent constipation: you may use a stool softener such as -  Colace (over the counter) 100 mg by mouth twice a day  Drink plenty of fluids (prune juice may be helpful) and high fiber foods Miralax (over the counter) for constipation as needed.    Itching:  If you experience itching with your medications, try taking only a single pain pill, or even half a pain pill at a time.  You may take up to 10 pain pills per day, and you can also use benadryl over the counter for itching or also to help with sleep.   Precautions:  If you experience chest pain or shortness of breath - call 911 immediately for transfer to the hospital emergency department!!  If you develop a fever greater that 101 F, purulent drainage from wound, increased redness or drainage from wound, or calf pain -- Call the office at [phone]                                                Follow- Up Appointment:  Please call for an appointment to be seen in 2 weeks [place] - [phone]   Regional Anesthesia Blocks  1. Numbness or the inability to move the "blocked" extremity may last from 3-48 hours after placement. The length of time depends on the medication  injected and your individual response to the medication. If the numbness is not going away after 48 hours, call your surgeon.  2. The extremity that is blocked will need to be protected until the numbness is gone and the  Strength has returned. Because you cannot feel it, you will need to take extra care to avoid injury. Because it may be weak, you may have difficulty moving it or using it. You may not know what position it is in without looking at it while the block is in effect.  3. For blocks in the legs and feet, returning to weight bearing and walking needs to be done carefully. You will need to wait until the numbness is entirely gone and the strength has returned. You should be able to move your leg and foot normally before you try and bear weight or walk. You will need someone to be with you when you first try to ensure you do not fall and possibly risk injury.  4. Bruising and tenderness at the needle site are common side effects and will resolve in a few days.  5. Persistent numbness or new problems with movement should be communicated to the surgeon or the [facility] [phone] [facility] [phone]).  Post Anesthesia Home Care Instructions  Activity: Get plenty of rest for the remainder of the day. A responsible adult should stay with you for 24 hours following the procedure.  For the next 24 hours, DO NOT: -Drive a car -Advertising copywriter -Drink alcoholic beverages -Take any medication unless instructed by your physician -Make any legal decisions or sign important papers.  Meals: Start with liquid foods such as gelatin or soup. Progress to regular foods as tolerated. Avoid greasy, spicy, heavy foods. If nausea and/or vomiting occur, drink only clear liquids until the nausea and/or vomiting subsides. Call your physician if vomiting continues.  Special Instructions/Symptoms: Your throat may feel dry or sore from the anesthesia or the breathing  tube placed in your throat during surgery. If this causes discomfort, gargle with warm salt water. The discomfort should disappear within 24 hours.

## 2014-05-25 ENCOUNTER — Encounter (HOSPITAL_BASED_OUTPATIENT_CLINIC_OR_DEPARTMENT_OTHER): Payer: Self-pay | Admitting: Orthopedic Surgery

## 2014-05-30 ENCOUNTER — Encounter (HOSPITAL_BASED_OUTPATIENT_CLINIC_OR_DEPARTMENT_OTHER): Payer: Self-pay | Admitting: Orthopedic Surgery

## 2014-09-18 ENCOUNTER — Ambulatory Visit (INDEPENDENT_AMBULATORY_CARE_PROVIDER_SITE_OTHER): Payer: 59 | Admitting: Family

## 2014-09-18 ENCOUNTER — Encounter: Payer: Self-pay | Admitting: Family

## 2014-09-18 ENCOUNTER — Other Ambulatory Visit (INDEPENDENT_AMBULATORY_CARE_PROVIDER_SITE_OTHER): Payer: 59

## 2014-09-18 VITALS — BP 122/74 | HR 64 | Temp 98.3°F | Resp 18 | Ht 70.0 in | Wt 173.0 lb

## 2014-09-18 DIAGNOSIS — R5383 Other fatigue: Secondary | ICD-10-CM

## 2014-09-18 LAB — CBC
HCT: 41.2 % (ref 36.0–46.0)
HEMOGLOBIN: 13.9 g/dL (ref 12.0–15.0)
MCHC: 33.8 g/dL (ref 30.0–36.0)
MCV: 94.1 fl (ref 78.0–100.0)
Platelets: 260 10*3/uL (ref 150.0–400.0)
RBC: 4.37 Mil/uL (ref 3.87–5.11)
RDW: 13.2 % (ref 11.5–15.5)
WBC: 6.5 10*3/uL (ref 4.0–10.5)

## 2014-09-18 LAB — TSH: TSH: 1.51 u[IU]/mL (ref 0.35–4.50)

## 2014-09-18 LAB — IBC PANEL
IRON: 58 ug/dL (ref 42–145)
SATURATION RATIOS: 14.5 % — AB (ref 20.0–50.0)
Transferrin: 285 mg/dL (ref 212.0–360.0)

## 2014-09-18 NOTE — Progress Notes (Signed)
Subjective:    Patient ID: Debra Fox, female    DOB: 06/18/1990, 24 y.o.   MRN: [id number]  Chief Complaint  Patient presents with  . Establish Care    x1 month, got bit by a tick and she said since then she has had low energy    HPI:  Debra Fox is a 24 y.o. female with a PMH of right knee surgery who presents today for an office visit to establish care.    1.) Tick bite - Associated symptom of a tick bite occurred about a month ago and notes additional fatigue compared to normal. Tick bite was located on her left hip and may have been there for about a day. Unsure if engorged or not. Denies fevers, chills or joint pain, or rashes. Notes the wound has since healed without any problems.    No Known Allergies   Outpatient Prescriptions Prior to Visit  Medication Sig Dispense Refill  . baclofen (LIORESAL) 10 MG tablet Take 1 tablet (10 mg total) by mouth 3 (three) times daily. As needed for muscle spasm 50 tablet 0  . ondansetron (ZOFRAN) 4 MG tablet Take 1 tablet (4 mg total) by mouth every 8 (eight) hours as needed for nausea or vomiting. 30 tablet 0  . oxyCODONE-acetaminophen (ROXICET) 5-325 MG per tablet Take 1-2 tablets by mouth every 6 (six) hours as needed for severe pain. 75 tablet 0  . sennosides-docusate sodium (SENOKOT-S) 8.6-50 MG tablet Take 2 tablets by mouth daily. 30 tablet 1   No facility-administered medications prior to visit.     Past Medical History  Diagnosis Date  . Derangement of posterior horn of medial meniscus of right knee 05/2014  . ACL tear 05/2014    right  . Complete tear of right ACL 05/19/2014  . Bucket-handle tear of medial meniscus of right knee as current injury 05/19/2014     Past Surgical History  Procedure Laterality Date  . No past surgeries    . Arthroscopy with anterior cruciate ligament (acl) repair with anterior tibilias graft Right 05/19/2014    Procedure: RIGHT KNEE ARTHROSCOPY MEDIAL MENISCECTOMY WITH ANTERIOR CRUCIATE LIGAMENT  (ACL) TIBIAL ANTERIOR ALLOGRAFT;  Surgeon: Teryl LucyJoshua Landau, MD;  Location: [facility];  Service: Orthopedics;  Laterality: Right;  . Meniscus repair  05/19/2014    Procedure: REPAIR OF MENISCUS;  Surgeon: Teryl LucyJoshua Landau, MD;  Location:  SURGERY CENTER;  Service: Orthopedics;;     Family History  Problem Relation Age of Onset  . Healthy Mother   . Heart attack Maternal Grandmother   . Cancer Maternal Grandfather   . Healthy Paternal Grandmother   . Healthy Paternal Grandfather      History   Social History  . Marital Status: Married    Spouse Name: N/A  . Number of Children: 2  . Years of Education: 16   Occupational History  . [employer]    Social History Main Topics  . Smoking status: Never Smoker   . Smokeless tobacco: Never Used  . Alcohol Use: Yes     Comment: occasionally  . Drug Use: No  . Sexual Activity: Yes    Birth Control/ Protection: None   Other Topics Concern  . Not on file   Social History Narrative   Fun: Sleep   Denies religious beliefs effecting health care.   Denies abuse and feels safe at home.     Review of Systems  Constitutional: Positive for fatigue. Negative for  fever and chills.  Skin: Negative for rash.      Objective:    BP 122/74 mmHg  Pulse 64  Temp(Src) 98.3 F (36.8 C) (Oral)  Resp 18  Ht  (1.778 m)  Wt 173 lb (78.472 kg)  BMI 24.82 kg/m2  SpO2 97% Nursing note and vital signs reviewed.  Physical Exam  Constitutional: She is oriented to person, place, and time. She appears well-developed and well-nourished. No distress.  Cardiovascular: Normal rate, regular rhythm, normal heart sounds and intact distal pulses.   Pulmonary/Chest: Effort normal and breath sounds normal.  Neurological: She is alert and oriented to person, place, and time.  Skin: Skin is warm and dry.  Psychiatric: She has a normal mood and affect. Her behavior is normal. Judgment and thought content normal.         Assessment & Plan:   Problem List Items Addressed This Visit      Other   Fatigue - Primary    Fatigue of undetermined origin. Obtain Lyme disease, CBC, IVC panel, and thyroid to rule out metabolic causes. Cannot rule out underlying anxiety, depression, or cardiovascular disease. Follow-up pending results of lab work.      Relevant Orders   B. Burgdorfi Antibodies   TSH   CBC   IBC panel

## 2014-09-18 NOTE — Patient Instructions (Signed)
Thank you for choosing [facility].  Summary/Instructions:  If your symptoms worsen or fail to improve, please contact our office for further instruction, or in case of emergency go directly to the emergency room at the closest medical facility.   Fatigue Fatigue is a feeling of tiredness, lack of energy, lack of motivation, or feeling tired all the time. Having enough rest, good nutrition, and reducing stress will normally reduce fatigue. Consult your caregiver if it persists. The nature of your fatigue will help your caregiver to find out its cause. The treatment is based on the cause.  CAUSES  There are many causes for fatigue. Most of the time, fatigue can be traced to one or more of your habits or routines. Most causes fit into one or more of three general areas. They are: Lifestyle problems  Sleep disturbances.  Overwork.  Physical exertion.  Unhealthy habits.  Poor eating habits or eating disorders.  Alcohol and/or drug use .  Lack of proper nutrition (malnutrition). Psychological problems  Stress and/or anxiety problems.  Depression.  Grief.  Boredom. Medical Problems or Conditions  Anemia.  Pregnancy.  Thyroid gland problems.  Recovery from major surgery.  Continuous pain.  Emphysema or asthma that is not well controlled  Allergic conditions.  Diabetes.  Infections (such as mononucleosis).  Obesity.  Sleep disorders, such as sleep apnea.  Heart failure or other heart-related problems.  Cancer.  Kidney disease.  Liver disease.  Effects of certain medicines such as antihistamines, cough and cold remedies, prescription pain medicines, heart and blood pressure medicines, drugs used for treatment of cancer, and some antidepressants. SYMPTOMS  The symptoms of fatigue include:   Lack of energy.  Lack of drive (motivation).  Drowsiness.  Feeling of indifference to the surroundings. DIAGNOSIS  The details of how you feel help guide  your caregiver in finding out what is causing the fatigue. You will be asked about your present and past health condition. It is important to review all medicines that you take, including prescription and non-prescription items. A thorough exam will be done. You will be questioned about your feelings, habits, and normal lifestyle. Your caregiver may suggest blood tests, urine tests, or other tests to look for common medical causes of fatigue.  TREATMENT  Fatigue is treated by correcting the underlying cause. For example, if you have continuous pain or depression, treating these causes will improve how you feel. Similarly, adjusting the dose of certain medicines will help in reducing fatigue.  HOME CARE INSTRUCTIONS   Try to get the required amount of good sleep every night.  Eat a healthy and nutritious diet, and drink enough water throughout the day.  Practice ways of relaxing (including yoga or meditation).  Exercise regularly.  Make plans to change situations that cause stress. Act on those plans so that stresses decrease over time. Keep your work and personal routine reasonable.  Avoid street drugs and minimize use of alcohol.  Start taking a daily multivitamin after consulting your caregiver. SEEK MEDICAL CARE IF:   You have persistent tiredness, which cannot be accounted for.  You have fever.  You have unintentional weight loss.  You have headaches.  You have disturbed sleep throughout the night.  You are feeling sad.  You have constipation.  You have dry skin.  You have gained weight.  You are taking any new or different medicines that you suspect are causing fatigue.  You are unable to sleep at night.  You develop any unusual swelling of your   legs or other parts of your body. SEEK IMMEDIATE MEDICAL CARE IF:   You are feeling confused.  Your vision is blurred.  You feel faint or pass out.  You develop severe headache.  You develop severe abdominal, pelvic,  or back pain.  You develop chest pain, shortness of breath, or an irregular or fast heartbeat.  You are unable to pass a normal amount of urine.  You develop abnormal bleeding such as bleeding from the rectum or you vomit blood.  You have thoughts about harming yourself or committing suicide.  You are worried that you might harm someone else. MAKE SURE YOU:   Understand these instructions.  Will watch your condition.  Will get help right away if you are not doing well or get worse. Document Released: 12/15/2006 Document Revised: 05/12/2011 Document Reviewed: 06/21/2013 ExitCare Patient Information 2015 [employer]. This information is not intended to replace advice given to you by your health care provider. Make sure you discuss any questions you have with your health care provider.  

## 2014-09-18 NOTE — Progress Notes (Signed)
Pre visit review using our clinic review tool, if applicable. No additional management support is needed unless otherwise documented below in the visit note. 

## 2014-09-18 NOTE — Assessment & Plan Note (Signed)
Fatigue of undetermined origin. Obtain Lyme disease, CBC, IVC panel, and thyroid to rule out metabolic causes. Cannot rule out underlying anxiety, depression, or cardiovascular disease. Follow-up pending results of lab work.

## 2014-09-19 LAB — B. BURGDORFI ANTIBODIES: B burgdorferi Ab IgG+IgM: 0.52 {ISR}

## 2014-09-20 ENCOUNTER — Telehealth: Payer: Self-pay | Admitting: Family

## 2014-09-20 NOTE — Telephone Encounter (Signed)
Please inform patient that her lab work reveals thyroid function, white/red blood cells, and iron are within the normal limits. Her lyme disease test is also negative. Therefore we do not have a definitive reason for your fatigue, but as we discussed we cannot rule out depression or anxiety. Please let me know if your symptoms do not improve. If the symptoms persist, I may recommend a brief period of medication.

## 2014-09-20 NOTE — Telephone Encounter (Signed)
Tried calling pt and mail box was full. Will try back later. 

## 2014-09-20 NOTE — Telephone Encounter (Signed)
Pt aware of lab results.  She is interested in starting medication if its possible to go ahead and start it. Please advise.

## 2014-09-20 NOTE — Telephone Encounter (Signed)
Patient returned your call.

## 2014-09-21 MED ORDER — SERTRALINE HCL 50 MG PO TABS: 50.0000 mg | ORAL_TABLET | Freq: Every day | ORAL | Status: DC

## 2014-09-21 NOTE — Telephone Encounter (Signed)
LVM letting pt know.  

## 2014-09-21 NOTE — Telephone Encounter (Signed)
I have sent a prescription for sertraline (Zolft) to her pharmacy. This medication may take up to 4-6 weeks to have an effect. Please have her follow up in about 6 weeks.

## 2014-11-21 ENCOUNTER — Ambulatory Visit (INDEPENDENT_AMBULATORY_CARE_PROVIDER_SITE_OTHER): Payer: 59 | Admitting: Family

## 2014-11-21 ENCOUNTER — Other Ambulatory Visit (INDEPENDENT_AMBULATORY_CARE_PROVIDER_SITE_OTHER): Payer: 59

## 2014-11-21 ENCOUNTER — Encounter: Payer: Self-pay | Admitting: Family

## 2014-11-21 ENCOUNTER — Telehealth: Payer: Self-pay | Admitting: Family

## 2014-11-21 VITALS — BP 104/72 | HR 66 | Temp 98.6°F | Resp 18 | Ht 70.0 in | Wt 175.0 lb

## 2014-11-21 DIAGNOSIS — Z0000 Encounter for general adult medical examination without abnormal findings: Secondary | ICD-10-CM

## 2014-11-21 LAB — LIPID PANEL
CHOL/HDL RATIO: 3
Cholesterol: 192 mg/dL (ref 0–200)
HDL: 58 mg/dL (ref >39.00)
LDL Cholesterol: 119 mg/dL — ABNORMAL HIGH (ref 0–99)
NONHDL: 134.45
Triglycerides: 78 mg/dL (ref 0.0–149.0)
VLDL: 15.6 mg/dL (ref 0.0–40.0)

## 2014-11-21 LAB — COMPREHENSIVE METABOLIC PANEL
ALK PHOS: 43 U/L (ref 39–117)
ALT: 13 U/L (ref 0–35)
AST: 18 U/L (ref 0–37)
Albumin: 4.7 g/dL (ref 3.5–5.2)
BILIRUBIN TOTAL: 0.6 mg/dL (ref 0.2–1.2)
BUN: 9 mg/dL (ref 6–23)
CO2: 31 mEq/L (ref 19–32)
Calcium: 9.6 mg/dL (ref 8.4–10.5)
Chloride: 101 mEq/L (ref 96–112)
Creatinine, Ser: 0.75 mg/dL (ref 0.40–1.20)
GFR: 99.51 mL/min (ref >60.00)
Glucose, Bld: 89 mg/dL (ref 70–99)
Potassium: 4.3 mEq/L (ref 3.5–5.1)
Sodium: 138 mEq/L (ref 135–145)
TOTAL PROTEIN: 7.8 g/dL (ref 6.0–8.3)

## 2014-11-21 LAB — CBC
HCT: 40.9 % (ref 36.0–46.0)
Hemoglobin: 13.8 g/dL (ref 12.0–15.0)
MCHC: 33.7 g/dL (ref 30.0–36.0)
MCV: 94.4 fl (ref 78.0–100.0)
PLATELETS: 241 10*3/uL (ref 150.0–400.0)
RBC: 4.34 Mil/uL (ref 3.87–5.11)
RDW: 13.6 % (ref 11.5–15.5)
WBC: 5.9 10*3/uL (ref 4.0–10.5)

## 2014-11-21 LAB — TSH: TSH: 1.32 u[IU]/mL (ref 0.35–4.50)

## 2014-11-21 MED ORDER — SERTRALINE HCL 50 MG PO TABS: 50.0000 mg | ORAL_TABLET | Freq: Every day | ORAL | Status: DC

## 2014-11-21 NOTE — Progress Notes (Signed)
Pre visit review using our clinic review tool, if applicable. No additional management support is needed unless otherwise documented below in the visit note. 

## 2014-11-21 NOTE — Progress Notes (Signed)
Subjective:    Patient ID: Debra Fox, female    DOB: Nov 03, 1990, 23 y.o.   MRN: [id number]  Chief Complaint  Patient presents with  . CPE    Not fasting, zoloft seems to be working well with her, may need refill    HPI:  Debra Fox is a 24 y.o. female who presents today for an annual wellness visit.   1) Health Maintenance -   Diet - Averages about 2 meals per consisting of vegetables, chicken, occasional dairy and fruit; 2-3 cups of caffeine daily  Exercise - Walks as she is able for about 15-20 minutes; was running but had recent knee surgery   2) Preventative Exams / Immunizations:  Dental -- Due exam   Vision -- Up to date   Health Maintenance  Topic Date Due  . INFLUENZA VACCINE  11/21/2015 (Originally 10/02/2014)  . PAP SMEAR  05/02/2016  . TETANUS/TDAP  09/29/2023  . HIV Screening  Completed  Discussed Gardisil   Immunization History  Administered Date(s) Administered  . Tdap 09/28/2013    No Known Allergies   Outpatient Prescriptions Prior to Visit  Medication Sig Dispense Refill  . sertraline (ZOLOFT) 50 MG tablet Take 1 tablet (50 mg total) by mouth daily. 30 tablet 1   No facility-administered medications prior to visit.     Past Medical History  Diagnosis Date  . Derangement of posterior horn of medial meniscus of right knee 05/2014  . ACL tear 05/2014    right  . Complete tear of right ACL 05/19/2014  . Bucket-handle tear of medial meniscus of right knee as current injury 05/19/2014     Past Surgical History  Procedure Laterality Date  . No past surgeries    . Arthroscopy with anterior cruciate ligament (acl) repair with anterior tibilias graft Right 05/19/2014    Procedure: RIGHT KNEE ARTHROSCOPY MEDIAL MENISCECTOMY WITH ANTERIOR CRUCIATE LIGAMENT (ACL) TIBIAL ANTERIOR ALLOGRAFT;  Surgeon: Teryl Lucy, MD;  Location: [facility];  Service: Orthopedics;  Laterality: Right;  . Meniscus repair  05/19/2014    Procedure: REPAIR OF  MENISCUS;  Surgeon: Teryl Lucy, MD;  Location: [facility];  Service: Orthopedics;;     Family History  Problem Relation Age of Onset  . Healthy Mother   . Heart attack Maternal Grandmother   . Cancer Maternal Grandfather   . Healthy Paternal Grandmother   . Healthy Paternal Grandfather      Social History   Social History  . Marital Status: Married    Spouse Name: N/A  . Number of Children: 2  . Years of Education: 16   Occupational History  . [employer]    Social History Main Topics  . Smoking status: Never Smoker   . Smokeless tobacco: Never Used  . Alcohol Use: Yes     Comment: occasionally  . Drug Use: No  . Sexual Activity: Yes    Birth Control/ Protection: None   Other Topics Concern  . Not on file   Social History Narrative   Fun: Sleep   Denies religious beliefs effecting health care.   Denies abuse and feels safe at home.      Review of Systems  Constitutional: Denies fever, chills, fatigue, or significant weight gain/loss. HENT: Head: Denies headache or neck pain Ears: Denies changes in hearing, ringing in ears, earache, drainage Nose: Denies discharge, stuffiness, itching, nosebleed, sinus pain Throat: Denies sore throat, hoarseness, dry mouth, sores, thrush Eyes: Denies loss/changes  in vision, pain, redness, blurry/double vision, flashing lights Cardiovascular: Denies chest pain/discomfort, tightness, palpitations, shortness of breath with activity, difficulty lying down, swelling, sudden awakening with shortness of breath Respiratory: Denies shortness of breath, cough, sputum production, wheezing Gastrointestinal: Denies dysphasia, heartburn, change in appetite, nausea, change in bowel habits, rectal bleeding, constipation, diarrhea, yellow skin or eyes Genitourinary: Denies frequency, urgency, burning/pain, blood in urine, incontinence, change in urinary strength. Musculoskeletal: Denies muscle/joint pain, stiffness,  back pain, redness or swelling of joints, trauma Skin: Denies rashes, lumps, itching, dryness, color changes, or hair/nail changes Neurological: Denies dizziness, fainting, seizures, weakness, numbness, tingling, tremor Psychiatric - Denies nervousness, stress, depression or memory loss Endocrine: Denies heat or cold intolerance, sweating, frequent urination, excessive thirst, changes in appetite Hematologic: Denies ease of bruising or bleeding     Objective:     BP 104/72 mmHg  Pulse 66  Temp(Src) 98.6 F (37 C) (Oral)  Resp 18  Ht  (1.778 m)  Wt 175 lb (79.379 kg)  BMI 25.11 kg/m2  SpO2 97% Nursing note and vital signs reviewed.  Depression screen PHQ 2/9 11/21/2014  Decreased Interest 0  Down, Depressed, Hopeless 0  PHQ - 2 Score 0    Physical Exam  Constitutional: She is oriented to person, place, and time. She appears well-developed and well-nourished.  HENT:  Head: Normocephalic.  Right Ear: Hearing, tympanic membrane, external ear and ear canal normal.  Left Ear: Hearing, tympanic membrane, external ear and ear canal normal.  Nose: Nose normal.  Mouth/Throat: Uvula is midline, oropharynx is clear and moist and mucous membranes are normal.  Eyes: Conjunctivae and EOM are normal. Pupils are equal, round, and reactive to light.  Neck: Neck supple. No JVD present. No tracheal deviation present. No thyromegaly present.  Cardiovascular: Normal rate, regular rhythm, normal heart sounds and intact distal pulses.   Pulmonary/Chest: Effort normal and breath sounds normal.  Abdominal: Soft. Bowel sounds are normal. She exhibits no distension and no mass. There is no tenderness. There is no rebound and no guarding.  Musculoskeletal: Normal range of motion. She exhibits no edema or tenderness.  Lymphadenopathy:    She has no cervical adenopathy.  Neurological: She is alert and oriented to person, place, and time. She has normal reflexes. No cranial nerve deficit. She  exhibits normal muscle tone. Coordination normal.  Skin: Skin is warm and dry.  Psychiatric: She has a normal mood and affect. Her behavior is normal. Judgment and thought content normal.       Assessment & Plan:   Problem List Items Addressed This Visit      Other   Routine general medical examination at a health care facility - Primary    1) Anticipatory Guidance: Discussed importance of wearing a seatbelt while driving and not texting while driving; changing batteries in smoke detector at least once annually; wearing suntan lotion when outside; eating a balanced and moderate diet; getting physical activity at least 30 minutes per day.  2) Immunizations / Screenings / Labs:  Declined flu shot. Discussed potential for Gardasil and patient will research and start series if interested. All other immunizations are up to date per recommendations. Due for a dental cleaning. All other screenings are up to date per recommendations. Obtain CBC, CMET, Lipid profile and TSH.   Overall well exam with minimal risk factors for cardiovascular or chronic disease. She is of good weight and does exercise. Encouraged continued exercise and possible increase to 30 minutes most days and also fine tuning  nutrition to emphasize nutrient density to optimize health. Continue other health lifestyle behaviors. Well woman exam per gynecology. Follow up prevention exam in 1 year and follow up office visit pending blood work.        Relevant Orders   Comprehensive metabolic panel   Lipid panel   TSH   CBC

## 2014-11-21 NOTE — Telephone Encounter (Signed)
Please inform patient that her blood work shows that her liver function, kidney function, electrolytes, thyroid function, white/red blood cells and cholesterol are all within the normal limits. Therefore please have her follow up in 6 months for a follow up of her sertaline or sooner if needed.

## 2014-11-21 NOTE — Patient Instructions (Addendum)
Thank you for choosing [facility].  Summary/Instructions:  Your prescription(s) have been submitted to your pharmacy or been printed and provided for you. Please take as directed and contact our office if you believe you are having problem(s) with the medication(s) or have any questions.  Human Papillomavirus Quadrivalent Vaccine suspension for injection What is this medicine? HUMAN PAPILLOMAVIRUS VACCINE (HYOO muhn pap uh LOH muh vahy ruhs vak SEEN) is a vaccine. It is used to prevent infections of four types of the human papillomavirus. In women, the vaccine may lower your risk of getting cervical, vaginal, vulvar, or anal cancer and genital warts. In men, the vaccine may lower your risk of getting genital warts and anal cancer. You cannot get these diseases from the vaccine. This vaccine does not treat these diseases. This medicine may be used for other purposes; ask your health care provider or pharmacist if you have questions. COMMON BRAND NAME(S): Gardasil What should I tell my health care provider before I take this medicine? They need to know if you have any of these conditions: -fever or infection -hemophilia -HIV infection or AIDS -immune system problems -low platelet count -an unusual reaction to Human Papillomavirus Vaccine, yeast, other medicines, foods, dyes, or preservatives -pregnant or trying to get pregnant -breast-feeding How should I use this medicine? This vaccine is for injection in a muscle on your upper arm or thigh. It is given by a health care professional. Dennis Bast will be observed for 15 minutes after each dose. Sometimes, fainting happens after the vaccine is given. You may be asked to sit or lie down during the 15 minutes. Three doses are given. The second dose is given 2 months after the first dose. The last dose is given 4 months after the second dose. A copy of a Vaccine Information Statement will be given before each vaccination. Read this sheet carefully  each time. The sheet may change frequently. Talk to your pediatrician regarding the use of this medicine in children. While this drug may be prescribed for children as young as 20 years of age for selected conditions, precautions do apply. Overdosage: If you think you have taken too much of this medicine contact a poison control center or emergency room at once. NOTE: This medicine is only for you. Do not share this medicine with others. What if I miss a dose? All 3 doses of the vaccine should be given within 6 months. Remember to keep appointments for follow-up doses. Your health care provider will tell you when to return for the next vaccine. Ask your health care professional for advice if you are unable to keep an appointment or miss a scheduled dose. What may interact with this medicine? -other vaccines This list may not describe all possible interactions. Give your health care provider a list of all the medicines, herbs, non-prescription drugs, or dietary supplements you use. Also tell them if you smoke, drink alcohol, or use illegal drugs. Some items may interact with your medicine. What should I watch for while using this medicine? This vaccine may not fully protect everyone. Continue to have regular pelvic exams and cervical or anal cancer screenings as directed by your doctor. The Human Papillomavirus is a sexually transmitted disease. It can be passed by any kind of sexual activity that involves genital contact. The vaccine works best when given before you have any contact with the virus. Many people who have the virus do not have any signs or symptoms. Tell your doctor or health care professional if  you have any reaction or unusual symptom after getting the vaccine. What side effects may I notice from receiving this medicine? Side effects that you should report to your doctor or health care professional as soon as possible: -allergic reactions like skin rash, itching or hives, swelling of  the face, lips, or tongue -breathing problems -feeling faint or lightheaded, falls Side effects that usually do not require medical attention (report to your doctor or health care professional if they continue or are bothersome): -cough -dizziness -fever -headache -nausea -redness, warmth, swelling, pain, or itching at site where injected This list may not describe all possible side effects. Call your doctor for medical advice about side effects. You may report side effects to FDA at 1-800-FDA-1088. Where should I keep my medicine? This drug is given in a hospital or clinic and will not be stored at home. NOTE: This sheet is a summary. It may not cover all possible information. If you have questions about this medicine, talk to your doctor, pharmacist, or health care provider.  2015, [employer]vier/[employer] Standard. (2013-04-11 13:14:33)  Health Maintenance Adopting a healthy lifestyle and getting preventive care can go a long way to promote health and wellness. Talk with your health care provider about what schedule of regular examinations is right for you. This is a good chance for you to check in with your provider about disease prevention and staying healthy. In between checkups, there are plenty of things you can do on your own. Experts have done a lot of research about which lifestyle changes and preventive measures are most likely to keep you healthy. Ask your health care provider for more information. WEIGHT AND DIET  Eat a healthy diet  Be sure to include plenty of vegetables, fruits, low-fat dairy products, and lean protein.  Do not eat a lot of foods high in solid fats, added sugars, or salt.  Get regular exercise. This is one of the most important things you can do for your health.  Most adults should exercise for at least 150 minutes each week. The exercise should increase your heart rate and make you sweat (moderate-intensity exercise).  Most adults should also do strengthening  exercises at least twice a week. This is in addition to the moderate-intensity exercise.  Maintain a healthy weight  Body mass index (BMI) is a measurement that can be used to identify possible weight problems. It estimates body fat based on height and weight. Your health care provider can help determine your BMI and help you achieve or maintain a healthy weight.  For females 17 years of age and older:   A BMI below 18.5 is considered underweight.  A BMI of 18.5 to 24.9 is normal.  A BMI of 25 to 29.9 is considered overweight.  A BMI of 30 and above is considered obese.  Watch levels of cholesterol and blood lipids  You should start having your blood tested for lipids and cholesterol at 25 years of age, then have this test every 5 years.  You may need to have your cholesterol levels checked more often if:  Your lipid or cholesterol levels are high.  You are older than 25 years of age.  You are at high risk for heart disease.  CANCER SCREENING   Lung Cancer  Lung cancer screening is recommended for adults 35-16 years old who are at high risk for lung cancer because of a history of smoking.  A yearly low-dose CT scan of the lungs is recommended for people who:  Currently smoke.  Have quit within the past 15 years.  Have at least a 30-pack-year history of smoking. A pack year is smoking an average of one pack of cigarettes a day for 1 year.  Yearly screening should continue until it has been 15 years since you quit.  Yearly screening should stop if you develop a health problem that would prevent you from having lung cancer treatment.  Breast Cancer  Practice breast self-awareness. This means understanding how your breasts normally appear and feel.  It also means doing regular breast self-exams. Let your health care provider know about any changes, no matter how small.  If you are in your 20s or 30s, you should have a clinical breast exam (CBE) by a health care  provider every 1-3 years as part of a regular health exam.  If you are 44 or older, have a CBE every year. Also consider having a breast X-ray (mammogram) every year.  If you have a family history of breast cancer, talk to your health care provider about genetic screening.  If you are at high risk for breast cancer, talk to your health care provider about having an MRI and a mammogram every year.  Breast cancer gene (BRCA) assessment is recommended for women who have family members with BRCA-related cancers. BRCA-related cancers include:  Breast.  Ovarian.  Tubal.  Peritoneal cancers.  Results of the assessment will determine the need for genetic counseling and BRCA1 and BRCA2 testing. Cervical Cancer Routine pelvic examinations to screen for cervical cancer are no longer recommended for nonpregnant women who are considered low risk for cancer of the pelvic organs (ovaries, uterus, and vagina) and who do not have symptoms. A pelvic examination may be necessary if you have symptoms including those associated with pelvic infections. Ask your health care provider if a screening pelvic exam is right for you.   The Pap test is the screening test for cervical cancer for women who are considered at risk.  If you had a hysterectomy for a problem that was not cancer or a condition that could lead to cancer, then you no longer need Pap tests.  If you are older than 65 years, and you have had normal Pap tests for the past 10 years, you no longer need to have Pap tests.  If you have had past treatment for cervical cancer or a condition that could lead to cancer, you need Pap tests and screening for cancer for at least 20 years after your treatment.  If you no longer get a Pap test, assess your risk factors if they change (such as having a new sexual partner). This can affect whether you should start being screened again.  Some women have medical problems that increase their chance of getting  cervical cancer. If this is the case for you, your health care provider may recommend more frequent screening and Pap tests.  The human papillomavirus (HPV) test is another test that may be used for cervical cancer screening. The HPV test looks for the virus that can cause cell changes in the cervix. The cells collected during the Pap test can be tested for HPV.  The HPV test can be used to screen women 34 years of age and older. Getting tested for HPV can extend the interval between normal Pap tests from three to five years.  An HPV test also should be used to screen women of any age who have unclear Pap test results.  After 25 years of age, women  should have HPV testing as often as Pap tests.  Colorectal Cancer  This type of cancer can be detected and often prevented.  Routine colorectal cancer screening usually begins at 25 years of age and continues through 25 years of age.  Your health care provider may recommend screening at an earlier age if you have risk factors for colon cancer.  Your health care provider may also recommend using home test kits to check for hidden blood in the stool.  A small camera at the end of a tube can be used to examine your colon directly (sigmoidoscopy or colonoscopy). This is done to check for the earliest forms of colorectal cancer.  Routine screening usually begins at age 69.  Direct examination of the colon should be repeated every 5-10 years through 25 years of age. However, you may need to be screened more often if early forms of precancerous polyps or small growths are found. Skin Cancer  Check your skin from head to toe regularly.  Tell your health care provider about any new moles or changes in moles, especially if there is a change in a mole's shape or color.  Also tell your health care provider if you have a mole that is larger than the size of a pencil eraser.  Always use sunscreen. Apply sunscreen liberally and repeatedly throughout the  day.  Protect yourself by wearing long sleeves, pants, a wide-brimmed hat, and sunglasses whenever you are outside. HEART DISEASE, DIABETES, AND HIGH BLOOD PRESSURE   Have your blood pressure checked at least every 1-2 years. High blood pressure causes heart disease and increases the risk of stroke.  If you are between 60 years and 48 years old, ask your health care provider if you should take aspirin to prevent strokes.  Have regular diabetes screenings. This involves taking a blood sample to check your fasting blood sugar level.  If you are at a normal weight and have a low risk for diabetes, have this test once every three years after 25 years of age.  If you are overweight and have a high risk for diabetes, consider being tested at a younger age or more often. PREVENTING INFECTION  Hepatitis B  If you have a higher risk for hepatitis B, you should be screened for this virus. You are considered at high risk for hepatitis B if:  You were born in a country where hepatitis B is common. Ask your health care provider which countries are considered high risk.  Your parents were born in a high-risk country, and you have not been immunized against hepatitis B (hepatitis B vaccine).  You have HIV or AIDS.  You use needles to inject street drugs.  You live with someone who has hepatitis B.  You have had sex with someone who has hepatitis B.  You get hemodialysis treatment.  You take certain medicines for conditions, including cancer, organ transplantation, and autoimmune conditions. Hepatitis C  Blood testing is recommended for:  Everyone born from [birth year] through 1966.  Anyone with known risk factors for hepatitis C. Sexually transmitted infections (STIs)  You should be screened for sexually transmitted infections (STIs) including gonorrhea and chlamydia if:  You are sexually active and are younger than 25 years of age.  You are older than 25 years of age and your health care  provider tells you that you are at risk for this type of infection.  Your sexual activity has changed since you were last screened and you are at an increased  risk for chlamydia or gonorrhea. Ask your health care provider if you are at risk.  If you do not have HIV, but are at risk, it may be recommended that you take a prescription medicine daily to prevent HIV infection. This is called pre-exposure prophylaxis (PrEP). You are considered at risk if:  You are sexually active and do not regularly use condoms or know the HIV status of your partner(s).  You take drugs by injection.  You are sexually active with a partner who has HIV. Talk with your health care provider about whether you are at high risk of being infected with HIV. If you choose to begin PrEP, you should first be tested for HIV. You should then be tested every 3 months for as long as you are taking PrEP.  PREGNANCY   If you are premenopausal and you may become pregnant, ask your health care provider about preconception counseling.  If you may become pregnant, take 400 to 800 micrograms (mcg) of folic acid every day.  If you want to prevent pregnancy, talk to your health care provider about birth control (contraception). OSTEOPOROSIS AND MENOPAUSE   Osteoporosis is a disease in which the bones lose minerals and strength with aging. This can result in serious bone fractures. Your risk for osteoporosis can be identified using a bone density scan.  If you are 36 years of age or older, or if you are at risk for osteoporosis and fractures, ask your health care provider if you should be screened.  Ask your health care provider whether you should take a calcium or vitamin D supplement to lower your risk for osteoporosis.  Menopause may have certain physical symptoms and risks.  Hormone replacement therapy may reduce some of these symptoms and risks. Talk to your health care provider about whether hormone replacement therapy is  right for you.  HOME CARE INSTRUCTIONS   Schedule regular health, dental, and eye exams.  Stay current with your immunizations.   Do not use any tobacco products including cigarettes, chewing tobacco, or electronic cigarettes.  If you are pregnant, do not drink alcohol.  If you are breastfeeding, limit how much and how often you drink alcohol.  Limit alcohol intake to no more than 1 drink per day for nonpregnant women. One drink equals 12 ounces of beer, 5 ounces of wine, or 1 ounces of hard liquor.  Do not use street drugs.  Do not share needles.  Ask your health care provider for help if you need support or information about quitting drugs.  Tell your health care provider if you often feel depressed.  Tell your health care provider if you have ever been abused or do not feel safe at home. Document Released: 09/02/2010 Document Revised: 07/04/2013 Document Reviewed: 01/19/2013 [facility] Patient Information 2015 [place], [place]. This information is not intended to replace advice given to you by your health care provider. Make sure you discuss any questions you have with your health care provider.

## 2014-11-21 NOTE — Assessment & Plan Note (Addendum)
1) Anticipatory Guidance: Discussed importance of wearing a seatbelt while driving and not texting while driving; changing batteries in smoke detector at least once annually; wearing suntan lotion when outside; eating a balanced and moderate diet; getting physical activity at least 30 minutes per day.  2) Immunizations / Screenings / Labs:  Declined flu shot. Discussed potential for Gardasil and patient will research and start series if interested. All other immunizations are up to date per recommendations. Due for a dental cleaning. All other screenings are up to date per recommendations. Obtain CBC, CMET, Lipid profile and TSH.   Overall well exam with minimal risk factors for cardiovascular or chronic disease. She is of good weight and does exercise. Encouraged continued exercise and possible increase to 30 minutes most days and also fine tuning nutrition to emphasize nutrient density to optimize health. Continue other health lifestyle behaviors. Well woman exam per gynecology. Follow up prevention exam in 1 year and follow up office visit pending blood work.

## 2014-11-22 NOTE — Telephone Encounter (Signed)
Pt aware of results 

## 2014-11-23 ENCOUNTER — Ambulatory Visit: Payer: 59 | Admitting: Family

## 2015-04-25 ENCOUNTER — Other Ambulatory Visit (INDEPENDENT_AMBULATORY_CARE_PROVIDER_SITE_OTHER): Payer: 59

## 2015-04-25 ENCOUNTER — Encounter: Payer: Self-pay | Admitting: Family

## 2015-04-25 ENCOUNTER — Ambulatory Visit (INDEPENDENT_AMBULATORY_CARE_PROVIDER_SITE_OTHER): Payer: 59 | Admitting: Family

## 2015-04-25 VITALS — BP 110/72 | HR 63 | Temp 97.6°F | Resp 16 | Ht 70.0 in | Wt 178.0 lb

## 2015-04-25 DIAGNOSIS — Z0000 Encounter for general adult medical examination without abnormal findings: Secondary | ICD-10-CM

## 2015-04-25 LAB — LIPID PANEL
CHOL/HDL RATIO: 4
CHOLESTEROL: 175 mg/dL (ref 0–200)
HDL: 46 mg/dL (ref >39.00)
LDL CALC: 113 mg/dL — AB (ref 0–99)
NonHDL: 129.07
TRIGLYCERIDES: 78 mg/dL (ref 0.0–149.0)
VLDL: 15.6 mg/dL (ref 0.0–40.0)

## 2015-04-25 LAB — COMPREHENSIVE METABOLIC PANEL
ALBUMIN: 4.8 g/dL (ref 3.5–5.2)
ALT: 13 U/L (ref 0–35)
AST: 18 U/L (ref 0–37)
Alkaline Phosphatase: 39 U/L (ref 39–117)
BUN: 8 mg/dL (ref 6–23)
CALCIUM: 9.3 mg/dL (ref 8.4–10.5)
CHLORIDE: 104 meq/L (ref 96–112)
CO2: 29 meq/L (ref 19–32)
Creatinine, Ser: 0.81 mg/dL (ref 0.40–1.20)
GFR: 90.75 mL/min (ref >60.00)
Glucose, Bld: 85 mg/dL (ref 70–99)
Potassium: 4.2 mEq/L (ref 3.5–5.1)
Sodium: 140 mEq/L (ref 135–145)
Total Bilirubin: 0.4 mg/dL (ref 0.2–1.2)
Total Protein: 7.6 g/dL (ref 6.0–8.3)

## 2015-04-25 NOTE — Progress Notes (Signed)
Pre visit review using our clinic review tool, if applicable. No additional management support is needed unless otherwise documented below in the visit note. 

## 2015-04-25 NOTE — Patient Instructions (Signed)
Thank you for choosing [facility].  Summary/Instructions:   Please stop by the lab on the basement level of the building for your blood work. Your results will be released to [place] (or called to you) after review, usually within 72 hours after test completion. If any changes need to be made, you will be notified at that same time.  Health Maintenance, Female Adopting a healthy lifestyle and getting preventive care can go a long way to promote health and wellness. Talk with your health care provider about what schedule of regular examinations is right for you. This is a good chance for you to check in with your provider about disease prevention and staying healthy. In between checkups, there are plenty of things you can do on your own. Experts have done a lot of research about which lifestyle changes and preventive measures are most likely to keep you healthy. Ask your health care provider for more information. WEIGHT AND DIET  Eat a healthy diet  Be sure to include plenty of vegetables, fruits, low-fat dairy products, and lean protein.  Do not eat a lot of foods high in solid fats, added sugars, or salt.  Get regular exercise. This is one of the most important things you can do for your health.  Most adults should exercise for at least 150 minutes each week. The exercise should increase your heart rate and make you sweat (moderate-intensity exercise).  Most adults should also do strengthening exercises at least twice a week. This is in addition to the moderate-intensity exercise.  Maintain a healthy weight  Body mass index (BMI) is a measurement that can be used to identify possible weight problems. It estimates body fat based on height and weight. Your health care provider can help determine your BMI and help you achieve or maintain a healthy weight.  For females 72 years of age and older:   A BMI below 18.5 is considered underweight.  A BMI of 18.5 to 24.9 is normal.  A  BMI of 25 to 29.9 is considered overweight.  A BMI of 30 and above is considered obese.  Watch levels of cholesterol and blood lipids  You should start having your blood tested for lipids and cholesterol at 26 years of age, then have this test every 5 years.  You may need to have your cholesterol levels checked more often if:  Your lipid or cholesterol levels are high.  You are older than 26 years of age.  You are at high risk for heart disease.  CANCER SCREENING   Lung Cancer  Lung cancer screening is recommended for adults 41-24 years old who are at high risk for lung cancer because of a history of smoking.  A yearly low-dose CT scan of the lungs is recommended for people who:  Currently smoke.  Have quit within the past 15 years.  Have at least a 30-pack-year history of smoking. A pack year is smoking an average of one pack of cigarettes a day for 1 year.  Yearly screening should continue until it has been 15 years since you quit.  Yearly screening should stop if you develop a health problem that would prevent you from having lung cancer treatment.  Breast Cancer  Practice breast self-awareness. This means understanding how your breasts normally appear and feel.  It also means doing regular breast self-exams. Let your health care provider know about any changes, no matter how small.  If you are in your 20s or 30s, you should have a  clinical breast exam (CBE) by a health care provider every 1-3 years as part of a regular health exam.  If you are 9 or older, have a CBE every year. Also consider having a breast X-ray (mammogram) every year.  If you have a family history of breast cancer, talk to your health care provider about genetic screening.  If you are at high risk for breast cancer, talk to your health care provider about having an MRI and a mammogram every year.  Breast cancer gene (BRCA) assessment is recommended for women who have family members with  BRCA-related cancers. BRCA-related cancers include:  Breast.  Ovarian.  Tubal.  Peritoneal cancers.  Results of the assessment will determine the need for genetic counseling and BRCA1 and BRCA2 testing. Cervical Cancer Your health care provider may recommend that you be screened regularly for cancer of the pelvic organs (ovaries, uterus, and vagina). This screening involves a pelvic examination, including checking for microscopic changes to the surface of your cervix (Pap test). You may be encouraged to have this screening done every 3 years, beginning at age 48.  For women ages 32-65, health care providers may recommend pelvic exams and Pap testing every 3 years, or they may recommend the Pap and pelvic exam, combined with testing for human papilloma virus (HPV), every 5 years. Some types of HPV increase your risk of cervical cancer. Testing for HPV may also be done on women of any age with unclear Pap test results.  Other health care providers may not recommend any screening for nonpregnant women who are considered low risk for pelvic cancer and who do not have symptoms. Ask your health care provider if a screening pelvic exam is right for you.  If you have had past treatment for cervical cancer or a condition that could lead to cancer, you need Pap tests and screening for cancer for at least 20 years after your treatment. If Pap tests have been discontinued, your risk factors (such as having a new sexual partner) need to be reassessed to determine if screening should resume. Some women have medical problems that increase the chance of getting cervical cancer. In these cases, your health care provider may recommend more frequent screening and Pap tests. Colorectal Cancer  This type of cancer can be detected and often prevented.  Routine colorectal cancer screening usually begins at 26 years of age and continues through 26 years of age.  Your health care provider may recommend screening at  an earlier age if you have risk factors for colon cancer.  Your health care provider may also recommend using home test kits to check for hidden blood in the stool.  A small camera at the end of a tube can be used to examine your colon directly (sigmoidoscopy or colonoscopy). This is done to check for the earliest forms of colorectal cancer.  Routine screening usually begins at age 21.  Direct examination of the colon should be repeated every 5-10 years through 26 years of age. However, you may need to be screened more often if early forms of precancerous polyps or small growths are found. Skin Cancer  Check your skin from head to toe regularly.  Tell your health care provider about any new moles or changes in moles, especially if there is a change in a mole's shape or color.  Also tell your health care provider if you have a mole that is larger than the size of a pencil eraser.  Always use sunscreen. Apply sunscreen liberally  and repeatedly throughout the day.  Protect yourself by wearing long sleeves, pants, a wide-brimmed hat, and sunglasses whenever you are outside. HEART DISEASE, DIABETES, AND HIGH BLOOD PRESSURE   High blood pressure causes heart disease and increases the risk of stroke. High blood pressure is more likely to develop in:  People who have blood pressure in the high end of the normal range (130-139/85-89 mm Hg).  People who are overweight or obese.  People who are African American.  If you are 18-39 years of age, have your blood pressure checked every 3-5 years. If you are 40 years of age or older, have your blood pressure checked every year. You should have your blood pressure measured twice--once when you are at a hospital or clinic, and once when you are not at a hospital or clinic. Record the average of the two measurements. To check your blood pressure when you are not at a hospital or clinic, you can use:  An automated blood pressure machine at a  pharmacy.  A home blood pressure monitor.  If you are between 55 years and 79 years old, ask your health care provider if you should take aspirin to prevent strokes.  Have regular diabetes screenings. This involves taking a blood sample to check your fasting blood sugar level.  If you are at a normal weight and have a low risk for diabetes, have this test once every three years after 26 years of age.  If you are overweight and have a high risk for diabetes, consider being tested at a younger age or more often. PREVENTING INFECTION  Hepatitis B  If you have a higher risk for hepatitis B, you should be screened for this virus. You are considered at high risk for hepatitis B if:  You were born in a country where hepatitis B is common. Ask your health care provider which countries are considered high risk.  Your parents were born in a high-risk country, and you have not been immunized against hepatitis B (hepatitis B vaccine).  You have HIV or AIDS.  You use needles to inject street drugs.  You live with someone who has hepatitis B.  You have had sex with someone who has hepatitis B.  You get hemodialysis treatment.  You take certain medicines for conditions, including cancer, organ transplantation, and autoimmune conditions. Hepatitis C  Blood testing is recommended for:  Everyone born from 1945 through 1965.  Anyone with known risk factors for hepatitis C. Sexually transmitted infections (STIs)  You should be screened for sexually transmitted infections (STIs) including gonorrhea and chlamydia if:  You are sexually active and are younger than 26 years of age.  You are older than 26 years of age and your health care provider tells you that you are at risk for this type of infection.  Your sexual activity has changed since you were last screened and you are at an increased risk for chlamydia or gonorrhea. Ask your health care provider if you are at risk.  If you do not  have HIV, but are at risk, it may be recommended that you take a prescription medicine daily to prevent HIV infection. This is called pre-exposure prophylaxis (PrEP). You are considered at risk if:  You are sexually active and do not regularly use condoms or know the HIV status of your partner(s).  You take drugs by injection.  You are sexually active with a partner who has HIV. Talk with your health care provider about whether you are   at high risk of being infected with HIV. If you choose to begin PrEP, you should first be tested for HIV. You should then be tested every 3 months for as long as you are taking PrEP.  PREGNANCY   If you are premenopausal and you may become pregnant, ask your health care provider about preconception counseling.  If you may become pregnant, take 400 to 800 micrograms (mcg) of folic acid every day.  If you want to prevent pregnancy, talk to your health care provider about birth control (contraception). OSTEOPOROSIS AND MENOPAUSE   Osteoporosis is a disease in which the bones lose minerals and strength with aging. This can result in serious bone fractures. Your risk for osteoporosis can be identified using a bone density scan.  If you are 54 years of age or older, or if you are at risk for osteoporosis and fractures, ask your health care provider if you should be screened.  Ask your health care provider whether you should take a calcium or vitamin D supplement to lower your risk for osteoporosis.  Menopause may have certain physical symptoms and risks.  Hormone replacement therapy may reduce some of these symptoms and risks. Talk to your health care provider about whether hormone replacement therapy is right for you.  HOME CARE INSTRUCTIONS   Schedule regular health, dental, and eye exams.  Stay current with your immunizations.   Do not use any tobacco products including cigarettes, chewing tobacco, or electronic cigarettes.  If you are pregnant, do not  drink alcohol.  If you are breastfeeding, limit how much and how often you drink alcohol.  Limit alcohol intake to no more than 1 drink per day for nonpregnant women. One drink equals 12 ounces of beer, 5 ounces of wine, or 1 ounces of hard liquor.  Do not use street drugs.  Do not share needles.  Ask your health care provider for help if you need support or information about quitting drugs.  Tell your health care provider if you often feel depressed.  Tell your health care provider if you have ever been abused or do not feel safe at home.   This information is not intended to replace advice given to you by your health care provider. Make sure you discuss any questions you have with your health care provider.   Document Released: 09/02/2010 Document Revised: 03/10/2014 Document Reviewed: 01/19/2013 Elsevier Interactive Patient Education [employer].

## 2015-04-25 NOTE — Progress Notes (Signed)
Subjective:    Patient ID: Debra Fox, female    DOB: 08-Jul-1990, 25 y.o.   MRN: [id number]  Chief Complaint  Patient presents with  . CPE    Fasting     HPI:  Debra Fox is a 25 y.o. female who presents today for an annual wellness visit.   1) Health Maintenance -   Diet - Averages about 2 meals per day consisting of lean cuisines and meats and vegetables, occasional fruit. Caffeine intake of about 2-3 cups per day   Exercise - 5x per week cardio and resistance training    2) Preventative Exams / Immunizations:  Dental -- Due for exam  Vision -- Up to date   Health Maintenance  Topic Date Due  . INFLUENZA VACCINE  11/21/2015 (Originally 10/02/2014)  . PAP SMEAR  05/02/2016  . TETANUS/TDAP  09/29/2023  . HIV Screening  Completed    Immunization History  Administered Date(s) Administered  . Tdap 09/28/2013    No Known Allergies   Outpatient Prescriptions Prior to Visit  Medication Sig Dispense Refill  . sertraline (ZOLOFT) 50 MG tablet Take 1 tablet (50 mg total) by mouth daily. 90 tablet 1   No facility-administered medications prior to visit.     Past Medical History  Diagnosis Date  . Derangement of posterior horn of medial meniscus of right knee 05/2014  . ACL tear 05/2014    right  . Complete tear of right ACL 05/19/2014  . Bucket-handle tear of medial meniscus of right knee as current injury 05/19/2014     Past Surgical History  Procedure Laterality Date  . No past surgeries    . Arthroscopy with anterior cruciate ligament (acl) repair with anterior tibilias graft Right 05/19/2014    Procedure: RIGHT KNEE ARTHROSCOPY MEDIAL MENISCECTOMY WITH ANTERIOR CRUCIATE LIGAMENT (ACL) TIBIAL ANTERIOR ALLOGRAFT;  Surgeon: Teryl Lucy, MD;  Location: [facility];  Service: Orthopedics;  Laterality: Right;  . Meniscus repair  05/19/2014    Procedure: REPAIR OF MENISCUS;  Surgeon: Teryl Lucy, MD;  Location: [facility];  Service:  Orthopedics;;     Family History  Problem Relation Age of Onset  . Healthy Mother   . Heart attack Maternal Grandmother   . Cancer Maternal Grandfather   . Healthy Paternal Grandmother   . Healthy Paternal Grandfather      Social History   Social History  . Marital Status: Married    Spouse Name: N/A  . Number of Children: 2  . Years of Education: 16   Occupational History  . [employer]    Social History Main Topics  . Smoking status: Never Smoker   . Smokeless tobacco: Never Used  . Alcohol Use: Yes     Comment: occasionally  . Drug Use: No  . Sexual Activity: Yes    Birth Control/ Protection: None   Other Topics Concern  . Not on file   Social History Narrative   Fun: Sleep   Denies religious beliefs effecting health care.   Denies abuse and feels safe at home.     Review of Systems  Constitutional: Denies fever, chills, fatigue, or significant weight gain/loss. HENT: Head: Denies headache or neck pain Ears: Denies changes in hearing, ringing in ears, earache, drainage Nose: Denies discharge, stuffiness, itching, nosebleed, sinus pain Throat: Denies sore throat, hoarseness, dry mouth, sores, thrush Eyes: Denies loss/changes in vision, pain, redness, blurry/double vision, flashing lights Cardiovascular: Denies chest pain/discomfort, tightness, palpitations, shortness  of breath with activity, difficulty lying down, swelling, sudden awakening with shortness of breath Respiratory: Denies shortness of breath, cough, sputum production, wheezing Gastrointestinal: Denies dysphasia, heartburn, change in appetite, nausea, change in bowel habits, rectal bleeding, constipation, diarrhea, yellow skin or eyes Genitourinary: Denies frequency, urgency, burning/pain, blood in urine, incontinence, change in urinary strength. Musculoskeletal: Denies muscle/joint pain, stiffness, back pain, redness or swelling of joints, trauma Skin: Denies rashes, lumps, itching,  dryness, color changes, or hair/nail changes Neurological: Denies dizziness, fainting, seizures, weakness, numbness, tingling, tremor Psychiatric - Denies nervousness, stress, depression or memory loss Endocrine: Denies heat or cold intolerance, sweating, frequent urination, excessive thirst, changes in appetite Hematologic: Denies ease of bruising or bleeding     Objective:     BP 110/72 mmHg  Pulse 63  Temp(Src) 97.6 F (36.4 C) (Oral)  Resp 16  Ht  (1.778 m)  Wt 178 lb (80.74 kg)  BMI 25.54 kg/m2  SpO2 97% Nursing note and vital signs reviewed.  Physical Exam  Constitutional: She is oriented to person, place, and time. She appears well-developed and well-nourished.  HENT:  Head: Normocephalic.  Right Ear: Hearing, tympanic membrane, external ear and ear canal normal.  Left Ear: Hearing, tympanic membrane, external ear and ear canal normal.  Nose: Nose normal.  Mouth/Throat: Uvula is midline, oropharynx is clear and moist and mucous membranes are normal.  Eyes: Conjunctivae and EOM are normal. Pupils are equal, round, and reactive to light.  Neck: Neck supple. No JVD present. No tracheal deviation present. No thyromegaly present.  Cardiovascular: Normal rate, regular rhythm, normal heart sounds and intact distal pulses.   Pulmonary/Chest: Effort normal and breath sounds normal.  Abdominal: Soft. Bowel sounds are normal. She exhibits no distension and no mass. There is no tenderness. There is no rebound and no guarding.  Musculoskeletal: Normal range of motion. She exhibits no edema or tenderness.  Lymphadenopathy:    She has no cervical adenopathy.  Neurological: She is alert and oriented to person, place, and time. She has normal reflexes. No cranial nerve deficit. She exhibits normal muscle tone. Coordination normal.  Skin: Skin is warm and dry.  Psychiatric: She has a normal mood and affect. Her behavior is normal. Judgment and thought content normal.         Assessment & Plan:   Problem List Items Addressed This Visit      Other   Routine general medical examination at a health care facility - Primary    1) Anticipatory Guidance: Discussed importance of wearing a seatbelt while driving and not texting while driving; changing batteries in smoke detector at least once annually; wearing suntan lotion when outside; eating a balanced and moderate diet; getting physical activity at least 30 minutes per day.  2) Immunizations / Screenings / Labs:  Declines flu shot. All other immunizations are up-to-date per recommendations. Due for a dental screen which is encouraged to be completed independently. All other screenings are up-to-date per recommendations. Obtain CMET and lipid profile.  Overall well exam with minimal risk factors for cardiovascular disease at present. Encouraged increasing nutritional intake and focusing on a diet that is moderate, varied, and balanced and emphasizes nutrient dense foods and is low in saturated fats and processed/sugary foods. Continue physical activity. Continue other healthy lifestyle behaviors and choices. Follow-up prevention exam in 1 year. Follow-up office visit as needed pending blood work.       Relevant Orders   Comprehensive metabolic panel   Lipid panel

## 2015-04-25 NOTE — Assessment & Plan Note (Signed)
1) Anticipatory Guidance: Discussed importance of wearing a seatbelt while driving and not texting while driving; changing batteries in smoke detector at least once annually; wearing suntan lotion when outside; eating a balanced and moderate diet; getting physical activity at least 30 minutes per day.  2) Immunizations / Screenings / Labs:  Declines flu shot. All other immunizations are up-to-date per recommendations. Due for a dental screen which is encouraged to be completed independently. All other screenings are up-to-date per recommendations. Obtain CMET and lipid profile.  Overall well exam with minimal risk factors for cardiovascular disease at present. Encouraged increasing nutritional intake and focusing on a diet that is moderate, varied, and balanced and emphasizes nutrient dense foods and is low in saturated fats and processed/sugary foods. Continue physical activity. Continue other healthy lifestyle behaviors and choices. Follow-up prevention exam in 1 year. Follow-up office visit as needed pending blood work.

## 2015-04-26 ENCOUNTER — Telehealth: Payer: Self-pay

## 2015-04-26 ENCOUNTER — Telehealth: Payer: Self-pay | Admitting: Family

## 2015-04-26 NOTE — Telephone Encounter (Signed)
Please inform patient that her blood work looks good with no changes needed. She can plan to follow up in 1 year for a physical or sooner if needed.

## 2015-04-26 NOTE — Telephone Encounter (Signed)
LVM letting pt know that her CPE form that she brought in yesterday is filled out and ready for pick up. Did state that I will fax it if need be.

## 2015-04-30 NOTE — Telephone Encounter (Signed)
Pt is aware of results and did inform me that she picked up her CPE form.

## 2015-06-26 ENCOUNTER — Telehealth: Payer: Self-pay

## 2015-06-26 ENCOUNTER — Encounter: Payer: Self-pay | Admitting: Family

## 2015-06-26 ENCOUNTER — Ambulatory Visit (INDEPENDENT_AMBULATORY_CARE_PROVIDER_SITE_OTHER): Payer: 59 | Admitting: Family

## 2015-06-26 VITALS — BP 120/80 | HR 77 | Temp 97.9°F | Resp 16 | Ht 70.0 in | Wt 181.0 lb

## 2015-06-26 DIAGNOSIS — S39012A Strain of muscle, fascia and tendon of lower back, initial encounter: Secondary | ICD-10-CM

## 2015-06-26 MED ORDER — TIZANIDINE HCL 4 MG PO TABS: 4.0000 mg | ORAL_TABLET | Freq: Four times a day (QID) | ORAL | Status: DC | PRN

## 2015-06-26 MED ORDER — METHYLPREDNISOLONE ACETATE 80 MG/ML IJ SUSP
80.0000 mg | Freq: Once | INTRAMUSCULAR | Status: AC
  Administered 2015-06-26: 80 mg via INTRAMUSCULAR

## 2015-06-26 MED ORDER — DICLOFENAC SODIUM 2 % TD SOLN: 1.0000 "application " | Freq: Two times a day (BID) | TRANSDERMAL | Status: DC | PRN

## 2015-06-26 MED ORDER — NAPROXEN-ESOMEPRAZOLE 500-20 MG PO TBEC: 1.0000 | DELAYED_RELEASE_TABLET | Freq: Two times a day (BID) | ORAL | Status: DC | PRN

## 2015-06-26 MED ORDER — KETOROLAC TROMETHAMINE 60 MG/2ML IM SOLN
60.0000 mg | Freq: Once | INTRAMUSCULAR | Status: AC
  Administered 2015-06-26: 60 mg via INTRAMUSCULAR

## 2015-06-26 NOTE — Assessment & Plan Note (Signed)
Symptoms and exam consistent with strain of lumbar paraspinal musculature. Treat conservatively with in office injection of Toradol and Depo-Medrol. Start Pennsaid and Vimovo. Start tizanidine as needed for muscle spasm. Ice/heat and home exercise therapy. Follow-up if symptoms worsen or do not improve.

## 2015-06-26 NOTE — Patient Instructions (Signed)
Thank you for choosing [facility].  Summary/Instructions:  Ice/heat 2-3 times per day and after activity.  Home exercise therapy daily.  Pennsaid - 2 times per day about half a pack to the affected area.  Vimovo - 2x per day.  Ice/hot or Thermacare as needed.   Your prescription(s) have been submitted to your pharmacy or been printed and provided for you. Please take as directed and contact our office if you believe you are having problem(s) with the medication(s) or have any questions.  If your symptoms worsen or fail to improve, please contact our office for further instruction, or in case of emergency go directly to the emergency room at the closest medical facility.   Low Back Sprain With Rehab A sprain is an injury in which a ligament is torn. The ligaments of the lower back are vulnerable to sprains. However, they are strong and require great force to be injured. These ligaments are important for stabilizing the spinal column. Sprains are classified into three categories. Grade 1 sprains cause pain, but the tendon is not lengthened. Grade 2 sprains include a lengthened ligament, due to the ligament being stretched or partially ruptured. With grade 2 sprains there is still function, although the function may be decreased. Grade 3 sprains involve a complete tear of the tendon or muscle, and function is usually impaired. SYMPTOMS   Severe pain in the lower back.  Sometimes, a feeling of a "pop," "snap," or tear, at the time of injury.  Tenderness and sometimes swelling at the injury site.  Uncommonly, bruising (contusion) within 48 hours of injury.  Muscle spasms in the back. CAUSES  Low back sprains occur when a force is placed on the ligaments that is greater than they can handle. Common causes of injury include:  Performing a stressful act while off-balance.  Repetitive stressful activities that involve movement of the lower back.  Direct hit (trauma) to the lower  back. RISK INCREASES WITH:  Contact sports (football, wrestling).  Collisions (major skiing accidents).  Sports that require throwing or lifting (baseball, weightlifting).  Sports involving twisting of the spine (gymnastics, diving, tennis, golf).  Poor strength and flexibility.  Inadequate protection.  Previous back injury or surgery (especially fusion). PREVENTION  Wear properly fitted and padded protective equipment.  Warm up and stretch properly before activity.  Allow for adequate recovery between workouts.  Maintain physical fitness:  Strength, flexibility, and endurance.  Cardiovascular fitness.  Maintain a healthy body weight. PROGNOSIS  If treated properly, low back sprains usually heal with non-surgical treatment. The length of time for healing depends on the severity of the injury.  RELATED COMPLICATIONS   Recurring symptoms, resulting in a chronic problem.  Chronic inflammation and pain in the low back.  Delayed healing or resolution of symptoms, especially if activity is resumed too soon.  Prolonged impairment.  Unstable or arthritic joints of the low back. TREATMENT  Treatment first involves the use of ice and medicine, to reduce pain and inflammation. The use of strengthening and stretching exercises may help reduce pain with activity. These exercises may be performed at home or with a therapist. Severe injuries may require referral to a therapist for further evaluation and treatment, such as ultrasound. Your caregiver may advise that you wear a back brace or corset, to help reduce pain and discomfort. Often, prolonged bed rest results in greater harm then benefit. Corticosteroid injections may be recommended. However, these should be reserved for the most serious cases. It is important to  avoid using your back when lifting objects. At night, sleep on your back on a firm mattress, with a pillow placed under your knees. If non-surgical treatment is  unsuccessful, surgery may be needed.  MEDICATION   If pain medicine is needed, nonsteroidal anti-inflammatory medicines (aspirin and ibuprofen), or other minor pain relievers (acetaminophen), are often advised.  Do not take pain medicine for 7 days before surgery.  Prescription pain relievers may be given, if your caregiver thinks they are needed. Use only as directed and only as much as you need.  Ointments applied to the skin may be helpful.  Corticosteroid injections may be given by your caregiver. These injections should be reserved for the most serious cases, because they may only be given a certain number of times. HEAT AND COLD  Cold treatment (icing) should be applied for 10 to 15 minutes every 2 to 3 hours for inflammation and pain, and immediately after activity that aggravates your symptoms. Use ice packs or an ice massage.  Heat treatment may be used before performing stretching and strengthening activities prescribed by your caregiver, physical therapist, or athletic trainer. Use a heat pack or a warm water soak. SEEK MEDICAL CARE IF:   Symptoms get worse or do not improve in 2 to 4 weeks, despite treatment.  You develop numbness or weakness in either leg.  You lose bowel or bladder function.  Any of the following occur after surgery: fever, increased pain, swelling, redness, drainage of fluids, or bleeding in the affected area.  New, unexplained symptoms develop. (Drugs used in treatment may produce side effects.) EXERCISES  RANGE OF MOTION (ROM) AND STRETCHING EXERCISES - Low Back Sprain Most people with lower back pain will find that their symptoms get worse with excessive bending forward (flexion) or arching at the lower back (extension). The exercises that will help resolve your symptoms will focus on the opposite motion.  Your physician, physical therapist or athletic trainer will help you determine which exercises will be most helpful to resolve your lower back  pain. Do not complete any exercises without first consulting with your caregiver. Discontinue any exercises which make your symptoms worse, until you speak to your caregiver. If you have pain, numbness or tingling which travels down into your buttocks, leg or foot, the goal of the therapy is for these symptoms to move closer to your back and eventually resolve. Sometimes, these leg symptoms will get better, but your lower back pain may worsen. This is often an indication of progress in your rehabilitation. Be very alert to any changes in your symptoms and the activities in which you participated in the 24 hours prior to the change. Sharing this information with your caregiver will allow him or her to most efficiently treat your condition. These exercises may help you when beginning to rehabilitate your injury. Your symptoms may resolve with or without further involvement from your physician, physical therapist or athletic trainer. While completing these exercises, remember:   Restoring tissue flexibility helps normal motion to return to the joints. This allows healthier, less painful movement and activity.  An effective stretch should be held for at least 30 seconds.  A stretch should never be painful. You should only feel a gentle lengthening or release in the stretched tissue. FLEXION RANGE OF MOTION AND STRETCHING EXERCISES: STRETCH - Flexion, Single Knee to Chest   Lie on a firm bed or floor with both legs extended in front of you.  Keeping one leg in contact with the floor,  bring your opposite knee to your chest. Hold your leg in place by either grabbing behind your thigh or at your knee.  Pull until you feel a gentle stretch in your low back. Hold __________ seconds.  Slowly release your grasp and repeat the exercise with the opposite side. Repeat __________ times. Complete this exercise __________ times per day.  STRETCH - Flexion, Double Knee to Chest  Lie on a firm bed or floor with  both legs extended in front of you.  Keeping one leg in contact with the floor, bring your opposite knee to your chest.  Tense your stomach muscles to support your back and then lift your other knee to your chest. Hold your legs in place by either grabbing behind your thighs or at your knees.  Pull both knees toward your chest until you feel a gentle stretch in your low back. Hold __________ seconds.  Tense your stomach muscles and slowly return one leg at a time to the floor. Repeat __________ times. Complete this exercise __________ times per day.  STRETCH - Low Trunk Rotation  Lie on a firm bed or floor. Keeping your legs in front of you, bend your knees so they are both pointed toward the ceiling and your feet are flat on the floor.  Extend your arms out to the side. This will stabilize your upper body by keeping your shoulders in contact with the floor.  Gently and slowly drop both knees together to one side until you feel a gentle stretch in your low back. Hold for __________ seconds.  Tense your stomach muscles to support your lower back as you bring your knees back to the starting position. Repeat the exercise to the other side. Repeat __________ times. Complete this exercise __________ times per day  EXTENSION RANGE OF MOTION AND FLEXIBILITY EXERCISES: STRETCH - Extension, Prone on Elbows   Lie on your stomach on the floor, a bed will be too soft. Place your palms about shoulder width apart and at the height of your head.  Place your elbows under your shoulders. If this is too painful, stack pillows under your chest.  Allow your body to relax so that your hips drop lower and make contact more completely with the floor.  Hold this position for __________ seconds.  Slowly return to lying flat on the floor. Repeat __________ times. Complete this exercise __________ times per day.  RANGE OF MOTION - Extension, Prone Press Ups  Lie on your stomach on the floor, a bed will be  too soft. Place your palms about shoulder width apart and at the height of your head.  Keeping your back as relaxed as possible, slowly straighten your elbows while keeping your hips on the floor. You may adjust the placement of your hands to maximize your comfort. As you gain motion, your hands will come more underneath your shoulders.  Hold this position __________ seconds.  Slowly return to lying flat on the floor. Repeat __________ times. Complete this exercise __________ times per day.  RANGE OF MOTION- Quadruped, Neutral Spine   Assume a hands and knees position on a firm surface. Keep your hands under your shoulders and your knees under your hips. You may place padding under your knees for comfort.  Drop your head and point your tailbone toward the ground below you. This will round out your lower back like an angry cat. Hold this position for __________ seconds.  Slowly lift your head and release your tail bone so that your  back sags into a large arch, like an old horse.  Hold this position for __________ seconds.  Repeat this until you feel limber in your low back.  Now, find your "sweet spot." This will be the most comfortable position somewhere between the two previous positions. This is your neutral spine. Once you have found this position, tense your stomach muscles to support your low back.  Hold this position for __________ seconds. Repeat __________ times. Complete this exercise __________ times per day.  STRENGTHENING EXERCISES - Low Back Sprain These exercises may help you when beginning to rehabilitate your injury. These exercises should be done near your "sweet spot." This is the neutral, low-back arch, somewhere between fully rounded and fully arched, that is your least painful position. When performed in this safe range of motion, these exercises can be used for people who have either a flexion or extension based injury. These exercises may resolve your symptoms with or  without further involvement from your physician, physical therapist or athletic trainer. While completing these exercises, remember:   Muscles can gain both the endurance and the strength needed for everyday activities through controlled exercises.  Complete these exercises as instructed by your physician, physical therapist or athletic trainer. Increase the resistance and repetitions only as guided.  You may experience muscle soreness or fatigue, but the pain or discomfort you are trying to eliminate should never worsen during these exercises. If this pain does worsen, stop and make certain you are following the directions exactly. If the pain is still present after adjustments, discontinue the exercise until you can discuss the trouble with your caregiver. STRENGTHENING - Deep Abdominals, Pelvic Tilt   Lie on a firm bed or floor. Keeping your legs in front of you, bend your knees so they are both pointed toward the ceiling and your feet are flat on the floor.  Tense your lower abdominal muscles to press your low back into the floor. This motion will rotate your pelvis so that your tail bone is scooping upwards rather than pointing at your feet or into the floor. With a gentle tension and even breathing, hold this position for __________ seconds. Repeat __________ times. Complete this exercise __________ times per day.  STRENGTHENING - Abdominals, Crunches   Lie on a firm bed or floor. Keeping your legs in front of you, bend your knees so they are both pointed toward the ceiling and your feet are flat on the floor. Cross your arms over your chest.  Slightly tip your chin down without bending your neck.  Tense your abdominals and slowly lift your trunk high enough to just clear your shoulder blades. Lifting higher can put excessive stress on the lower back and does not further strengthen your abdominal muscles.  Control your return to the starting position. Repeat __________ times. Complete  this exercise __________ times per day.  STRENGTHENING - Quadruped, Opposite UE/LE Lift   Assume a hands and knees position on a firm surface. Keep your hands under your shoulders and your knees under your hips. You may place padding under your knees for comfort.  Find your neutral spine and gently tense your abdominal muscles so that you can maintain this position. Your shoulders and hips should form a rectangle that is parallel with the floor and is not twisted.  Keeping your trunk steady, lift your right hand no higher than your shoulder and then your left leg no higher than your hip. Make sure you are not holding your breath. Hold this  position for __________ seconds.  Continuing to keep your abdominal muscles tense and your back steady, slowly return to your starting position. Repeat with the opposite arm and leg. Repeat __________ times. Complete this exercise __________ times per day.  STRENGTHENING - Abdominals and Quadriceps, Straight Leg Raise   Lie on a firm bed or floor with both legs extended in front of you.  Keeping one leg in contact with the floor, bend the other knee so that your foot can rest flat on the floor.  Find your neutral spine, and tense your abdominal muscles to maintain your spinal position throughout the exercise.  Slowly lift your straight leg off the floor about 6 inches for a count of 15, making sure to not hold your breath.  Still keeping your neutral spine, slowly lower your leg all the way to the floor. Repeat this exercise with each leg __________ times. Complete this exercise __________ times per day. POSTURE AND BODY MECHANICS CONSIDERATIONS - Low Back Sprain Keeping correct posture when sitting, standing or completing your activities will reduce the stress put on different body tissues, allowing injured tissues a chance to heal and limiting painful experiences. The following are general guidelines for improved posture. Your physician or physical  therapist will provide you with any instructions specific to your needs. While reading these guidelines, remember:  The exercises prescribed by your provider will help you have the flexibility and strength to maintain correct postures.  The correct posture provides the best environment for your joints to work. All of your joints have less wear and tear when properly supported by a spine with good posture. This means you will experience a healthier, less painful body.  Correct posture must be practiced with all of your activities, especially prolonged sitting and standing. Correct posture is as important when doing repetitive low-stress activities (typing) as it is when doing a single heavy-load activity (lifting). RESTING POSITIONS Consider which positions are most painful for you when choosing a resting position. If you have pain with flexion-based activities (sitting, bending, stooping, squatting), choose a position that allows you to rest in a less flexed posture. You would want to avoid curling into a fetal position on your side. If your pain worsens with extension-based activities (prolonged standing, working overhead), avoid resting in an extended position such as sleeping on your stomach. Most people will find more comfort when they rest with their spine in a more neutral position, neither too rounded nor too arched. Lying on a non-sagging bed on your side with a pillow between your knees, or on your back with a pillow under your knees will often provide some relief. Keep in mind, being in any one position for a prolonged period of time, no matter how correct your posture, can still lead to stiffness. PROPER SITTING POSTURE In order to minimize stress and discomfort on your spine, you must sit with correct posture. Sitting with good posture should be effortless for a healthy body. Returning to good posture is a gradual process. Many people can work toward this most comfortably by using various  supports until they have the flexibility and strength to maintain this posture on their own. When sitting with proper posture, your ears will fall over your shoulders and your shoulders will fall over your hips. You should use the back of the chair to support your upper back. Your lower back will be in a neutral position, just slightly arched. You may place a small pillow or folded towel at the base  of your lower back for  support.  When working at a desk, create an environment that supports good, upright posture. Without extra support, muscles tire, which leads to excessive strain on joints and other tissues. Keep these recommendations in mind: CHAIR:  A chair should be able to slide under your desk when your back makes contact with the back of the chair. This allows you to work closely.  The chair's height should allow your eyes to be level with the upper part of your monitor and your hands to be slightly lower than your elbows. BODY POSITION  Your feet should make contact with the floor. If this is not possible, use a foot rest.  Keep your ears over your shoulders. This will reduce stress on your neck and low back. INCORRECT SITTING POSTURES  If you are feeling tired and unable to assume a healthy sitting posture, do not slouch or slump. This puts excessive strain on your back tissues, causing more damage and pain. Healthier options include:  Using more support, like a lumbar pillow.  Switching tasks to something that requires you to be upright or walking.  Talking a brief walk.  Lying down to rest in a neutral-spine position. PROLONGED STANDING WHILE SLIGHTLY LEANING FORWARD  When completing a task that requires you to lean forward while standing in one place for a long time, place either foot up on a stationary 2-4 inch high object to help maintain the best posture. When both feet are on the ground, the lower back tends to lose its slight inward curve. If this curve flattens (or becomes  too large), then the back and your other joints will experience too much stress, tire more quickly, and can cause pain. CORRECT STANDING POSTURES Proper standing posture should be assumed with all daily activities, even if they only take a few moments, like when brushing your teeth. As in sitting, your ears should fall over your shoulders and your shoulders should fall over your hips. You should keep a slight tension in your abdominal muscles to brace your spine. Your tailbone should point down to the ground, not behind your body, resulting in an over-extended swayback posture.  INCORRECT STANDING POSTURES  Common incorrect standing postures include a forward head, locked knees and/or an excessive swayback. WALKING Walk with an upright posture. Your ears, shoulders and hips should all line-up. PROLONGED ACTIVITY IN A FLEXED POSITION When completing a task that requires you to bend forward at your waist or lean over a low surface, try to find a way to stabilize 3 out of 4 of your limbs. You can place a hand or elbow on your thigh or rest a knee on the surface you are reaching across. This will provide you more stability, so that your muscles do not tire as quickly. By keeping your knees relaxed, or slightly bent, you will also reduce stress across your lower back. CORRECT LIFTING TECHNIQUES DO :  Assume a wide stance. This will provide you more stability and the opportunity to get as close as possible to the object which you are lifting.  Tense your abdominals to brace your spine. Bend at the knees and hips. Keeping your back locked in a neutral-spine position, lift using your leg muscles. Lift with your legs, keeping your back straight.  Test the weight of unknown objects before attempting to lift them.  Try to keep your elbows locked down at your sides in order get the best strength from your shoulders when carrying an object.  Always ask for help when lifting heavy or awkward  objects. INCORRECT LIFTING TECHNIQUES DO NOT:   Lock your knees when lifting, even if it is a small object.  Bend and twist. Pivot at your feet or move your feet when needing to change directions.  Assume that you can safely pick up even a paperclip without proper posture.   This information is not intended to replace advice given to you by your health care provider. Make sure you discuss any questions you have with your health care provider.   Document Released: 02/17/2005 Document Revised: 03/10/2014 Document Reviewed: 06/01/2008 [employer] Interactive Patient Education [employer]

## 2015-06-26 NOTE — Progress Notes (Signed)
Subjective:    Patient ID: Debra Fox, female    DOB: 11/11/1990, 25 y.o.   MRN: [id number]  Chief Complaint  Patient presents with  . Back Pain    was bending over in the shower to shave her legs and when she stood up she felt her back tighten, pain is in lower back, happened this morning    HPI:  Debra Fox is a 25 y.o. female who  has a past medical history of Derangement of posterior horn of medial meniscus of right knee (05/2014); ACL tear (05/2014); Complete tear of right ACL (05/19/2014); and Bucket-handle tear of medial meniscus of right knee as current injury (05/19/2014). and presents today For an acute office visit.  This is a new problem. Associated symptom of pain and tightening in her lower back has been going on for several hours following bending down and standing up. Denies any sounds/sensations heard or felt. Pain is described as tightness. Endorses that when she is laying flat she is okay and standing straight up she is also okay. Occasional radiculopathy down her right leg and possible into her hips. Modifying factors include Advil which has not helped with her pain very much. No changes to bowel/bladder function or saddle anesthesia.   No Known Allergies   No current outpatient prescriptions on file prior to visit.   No current facility-administered medications on file prior to visit.     Past Surgical History  Procedure Laterality Date  . No past surgeries    . Arthroscopy with anterior cruciate ligament (acl) repair with anterior tibilias graft Right 05/19/2014    Procedure: RIGHT KNEE ARTHROSCOPY MEDIAL MENISCECTOMY WITH ANTERIOR CRUCIATE LIGAMENT (ACL) TIBIAL ANTERIOR ALLOGRAFT;  Surgeon: Teryl LucyJoshua Landau, MD;  Location: [facility];  Service: Orthopedics;  Laterality: Right;  . Meniscus repair  05/19/2014    Procedure: REPAIR OF MENISCUS;  Surgeon: Teryl LucyJoshua Landau, MD;  Location: [facility];  Service: Orthopedics;;   Past Medical History    Diagnosis Date  . Derangement of posterior horn of medial meniscus of right knee 05/2014  . ACL tear 05/2014    right  . Complete tear of right ACL 05/19/2014  . Bucket-handle tear of medial meniscus of right knee as current injury 05/19/2014     Review of Systems  Constitutional: Negative for fever and chills.  Musculoskeletal: Positive for back pain.  Neurological: Negative for weakness and numbness.      Objective:    BP 120/80 mmHg  Pulse 77  Temp(Src) 97.9 F (36.6 C) (Oral)  Resp 16  Ht 5\' 10"  (1.778 m)  Wt 181 lb (82.101 kg)  BMI 25.97 kg/m2  SpO2 97% Nursing note and vital signs reviewed.  Physical Exam  Constitutional: She is oriented to person, place, and time. She appears well-developed and well-nourished. No distress.  Cardiovascular: Normal rate, regular rhythm, normal heart sounds and intact distal pulses.   Pulmonary/Chest: Effort normal and breath sounds normal.  Musculoskeletal:  Low back - no obvious deformity, discoloration, or edema noted. Significant muscle spasm and tenderness along right paraspinal musculature of the lumbar spine. Range of motion is severely restricted in all directions secondary to pain and discomfort. Distal pulses, sensation, and reflexes are intact and appropriate. Positive straight leg raise on the right side. Faber's deferred secondary to pain.  Neurological: She is alert and oriented to person, place, and time.  Skin: Skin is warm and dry.  Psychiatric: She has a normal mood and affect.  Her behavior is normal. Judgment and thought content normal.       Assessment & Plan:   Problem List Items Addressed This Visit      Musculoskeletal and Integument   Strain of lumbar paraspinal muscle - Primary    Symptoms and exam consistent with strain of lumbar paraspinal musculature. Treat conservatively with in office injection of Toradol and Depo-Medrol. Start Pennsaid and Vimovo. Start tizanidine as needed for muscle spasm. Ice/heat and  home exercise therapy. Follow-up if symptoms worsen or do not improve.      Relevant Medications   Diclofenac Sodium (PENNSAID) 2 % SOLN   Naproxen-Esomeprazole 500-20 MG TBEC   tiZANidine (ZANAFLEX) 4 MG tablet   ketorolac (TORADOL) injection 60 mg (Completed)   methylPREDNISolone acetate (DEPO-MEDROL) injection 80 mg (Completed)     I have discontinued Ms. Bannister's sertraline. I am also having her start on Diclofenac Sodium, Naproxen-Esomeprazole, and tiZANidine. We administered ketorolac and methylPREDNISolone acetate.   Meds ordered this encounter  Medications  . Diclofenac Sodium (PENNSAID) 2 % SOLN    Sig: Place 1 application onto the skin 2 (two) times daily as needed.    Dispense:  112 g    Refill:  1    Order Specific Question:  Supervising Provider    Answer:  Hillard Danker A [[id number]]  . Naproxen-Esomeprazole 500-20 MG TBEC    Sig: Take 1 tablet by mouth 2 (two) times daily as needed.    Dispense:  60 tablet    Refill:  0    Order Specific Question:  Supervising Provider    Answer:  Hillard Danker A [[id number]]  . tiZANidine (ZANAFLEX) 4 MG tablet    Sig: Take 1 tablet (4 mg total) by mouth every 6 (six) hours as needed for muscle spasms.    Dispense:  30 tablet    Refill:  0    Order Specific Question:  Supervising Provider    Answer:  Hillard Danker A [[id number]]  . ketorolac (TORADOL) injection 60 mg    Sig:   . methylPREDNISolone acetate (DEPO-MEDROL) injection 80 mg    Sig:      Follow-up: Return if symptoms worsen or fail to improve.  Jeanine Luz, FNP

## 2015-06-26 NOTE — Telephone Encounter (Signed)
Patient wanted you to call in pain meds her back hurts. I told her she needed to be seen. So the only thing you have open was a 9am. I put her in that spot. I hope that is ok?

## 2015-08-07 ENCOUNTER — Encounter: Payer: Self-pay | Admitting: Family Medicine

## 2015-08-07 ENCOUNTER — Ambulatory Visit (INDEPENDENT_AMBULATORY_CARE_PROVIDER_SITE_OTHER): Payer: 59 | Admitting: Family Medicine

## 2015-08-07 VITALS — HR 76 | Temp 99.3°F | Ht 70.0 in | Wt 176.2 lb

## 2015-08-07 DIAGNOSIS — R112 Nausea with vomiting, unspecified: Secondary | ICD-10-CM

## 2015-08-07 LAB — CBC WITH DIFFERENTIAL/PLATELET
Basophils Absolute: 0 10*3/uL (ref 0.0–0.1)
Basophils Relative: 0.1 % (ref 0.0–3.0)
EOS PCT: 0 % (ref 0.0–5.0)
Eosinophils Absolute: 0 10*3/uL (ref 0.0–0.7)
HCT: 42.3 % (ref 36.0–46.0)
Hemoglobin: 14.4 g/dL (ref 12.0–15.0)
Lymphocytes Relative: 6 % — ABNORMAL LOW (ref 12.0–46.0)
Lymphs Abs: 0.8 10*3/uL (ref 0.7–4.0)
MCHC: 34 g/dL (ref 30.0–36.0)
MCV: 93.8 fl (ref 78.0–100.0)
MONO ABS: 0.7 10*3/uL (ref 0.1–1.0)
Monocytes Relative: 5.4 % (ref 3.0–12.0)
Neutro Abs: 11.3 10*3/uL — ABNORMAL HIGH (ref 1.4–7.7)
PLATELETS: 274 10*3/uL (ref 150.0–400.0)
RBC: 4.51 Mil/uL (ref 3.87–5.11)
RDW: 13.6 % (ref 11.5–15.5)
WBC: 12.8 10*3/uL — ABNORMAL HIGH (ref 4.0–10.5)

## 2015-08-07 LAB — BASIC METABOLIC PANEL
BUN: 8 mg/dL (ref 6–23)
CALCIUM: 9.4 mg/dL (ref 8.4–10.5)
CO2: 29 meq/L (ref 19–32)
Chloride: 99 mEq/L (ref 96–112)
Creatinine, Ser: 0.88 mg/dL (ref 0.40–1.20)
GFR: 82.29 mL/min (ref >60.00)
Glucose, Bld: 95 mg/dL (ref 70–99)
Potassium: 4 mEq/L (ref 3.5–5.1)
Sodium: 137 mEq/L (ref 135–145)

## 2015-08-07 MED ORDER — PROMETHAZINE HCL 25 MG/ML IJ SOLN
25.0000 mg | Freq: Once | INTRAMUSCULAR | Status: AC
  Administered 2015-08-07: 25 mg via INTRAMUSCULAR

## 2015-08-07 MED ORDER — ONDANSETRON 4 MG PO TBDP: 4.0000 mg | ORAL_TABLET | Freq: Three times a day (TID) | ORAL | Status: DC | PRN

## 2015-08-07 MED ORDER — KETOROLAC TROMETHAMINE 60 MG/2ML IM SOLN
60.0000 mg | Freq: Once | INTRAMUSCULAR | Status: AC
  Administered 2015-08-07: 60 mg via INTRAMUSCULAR

## 2015-08-07 NOTE — Progress Notes (Signed)
HPI:  Debra Fox is a pleasant 25 year old here for an acute visit for nausea and vomiting. Her husband was sick 4 days ago with a very bad headache, body aches and nausea. He recovered and then she got sick yesterday morning. Symptoms include headache, body aches, low-grade temp less than 100, nausea, vomiting and sinus congestion. She has vomited 4 times today. She has had bowel movements today without watery or bloody diarrhea. She has not had anything to drink today as she feels like this will make her vomit more. She has a history of occasional headaches, but no migraine history. Denies shortness of breath, recent travel, recent abx, chest pain, dysuria, rash, tick bite, fever over 100, rash, inability to turn head or neck, vision changes, recent head injury or trauma, bilious emesis.  ROS: See pertinent positives and negatives per HPI.  Past Medical History  Diagnosis Date  . Derangement of posterior horn of medial meniscus of right knee 05/2014  . ACL tear 05/2014    right  . Complete tear of right ACL 05/19/2014  . Bucket-handle tear of medial meniscus of right knee as current injury 05/19/2014    Past Surgical History  Procedure Laterality Date  . No past surgeries    . Arthroscopy with anterior cruciate ligament (acl) repair with anterior tibilias graft Right 05/19/2014    Procedure: RIGHT KNEE ARTHROSCOPY MEDIAL MENISCECTOMY WITH ANTERIOR CRUCIATE LIGAMENT (ACL) TIBIAL ANTERIOR ALLOGRAFT;  Surgeon: Teryl LucyJoshua Landau, MD;  Location: [facility];  Service: Orthopedics;  Laterality: Right;  . Meniscus repair  05/19/2014    Procedure: REPAIR OF MENISCUS;  Surgeon: Teryl LucyJoshua Landau, MD;  Location: [facility];  Service: Orthopedics;;    Family History  Problem Relation Age of Onset  . Healthy Mother   . Heart attack Maternal Grandmother   . Cancer Maternal Grandfather   . Healthy Paternal Grandmother   . Healthy Paternal Grandfather     Social History   Social  History  . Marital Status: Married    Spouse Name: N/A  . Number of Children: 2  . Years of Education: 16   Occupational History  . [employer]    Social History Main Topics  . Smoking status: Never Smoker   . Smokeless tobacco: Never Used  . Alcohol Use: Yes     Comment: occasionally  . Drug Use: No  . Sexual Activity: Yes    Birth Control/ Protection: None   Other Topics Concern  . None   Social History Narrative   Fun: Sleep   Denies religious beliefs effecting health care.   Denies abuse and feels safe at home.     No current outpatient prescriptions on file.  EXAMCeasar Mons:  Filed Vitals:   08/07/15 1354  Pulse: 76  Temp: 99.3 F (37.4 C)    Body mass index is 25.28 kg/(m^2).  GENERAL: vitals reviewed and listed above, alert, oriented, in no acute distress  HEENT: atraumatic, conjunttiva clear, no obvious abnormalities on inspection of external nose and ears, normal appearance of ear canals and TMs, clear nasal congestion, mild post oropharyngeal erythema with PND, no tonsillar edema or exudate, no sinus TTP  NECK: no obvious masses on inspection, no meningeal signs, no neck stiffness on exam  LUNGS: clear to auscultation bilaterally, no wheezes, rales or rhonchi, good air movement  CV: HRRR, no peripheral edema  ABD: BS+, soft, NTTP, no rebound or guarding  MS: moves all extremities without noticeable abnormality  PSYCH: pleasant and  cooperative, no obvious depression or anxiety  ASSESSMENT AND PLAN:  Discussed the following assessment and plan: > 40 minutes spent with this patient with > 50% face to face. Non-intractable vomiting with nausea, vomiting of unspecified type - Plan: promethazine (PHENERGAN) injection 25 mg, ketorolac (TORADOL) injection 60 mg  -we discussed possible serious and likely etiologies, workup and treatment, treatment risks and return precautions, suspect viral illness given husband with similar symptoms a few days ago   -after this discussion, Chelsy opted opted for trial toradol and phenergan in clinic to see if can tolerate PO intake - if unable to tol po intake, worsening, not improving advise emergency room evaluation and treatment. She had sig improvement with toradol and phenergan and was able to drink a full cup of water and looked much better. -labs prior to leaving, zofran at home, follow up and return/emergency precuations -of course, we advised Jenaveve  to return or notify a doctor immediately if symptoms worsen or persist or new concerns arise.   -Patient advised to return or notify a doctor immediately if symptoms worsen or persist or new concerns arise.  There are no Patient Instructions on file for this visit.   Kriste Basque R.

## 2015-08-07 NOTE — Progress Notes (Signed)
Pre visit review using our clinic review tool, if applicable. No additional management support is needed unless otherwise documented below in the visit note. 

## 2015-08-07 NOTE — Patient Instructions (Signed)
BEFORE YOU LEAVE: -labs  Plenty of fluids with electrolytes.  Zofran as needed for nausea.  Aleve or tylenol per instructions for pain  See care immediately if worsening, unable to tolerate fluids, can't move head or neck, new concerns or are not starting to improve.

## 2016-03-01 IMAGING — CR DG KNEE COMPLETE 4+V*R*
4 series · 4 of 4 positions shown · non-contrast
Comparison: None.

CLINICAL DATA: Injury at 6 p.m.. RIGHT knee pain. Medial RIGHT knee
pain. Patient unable to fully extend the RIGHT knee.

EXAM:
RIGHT KNEE - COMPLETE 4+ VIEW

[t knee ap right]
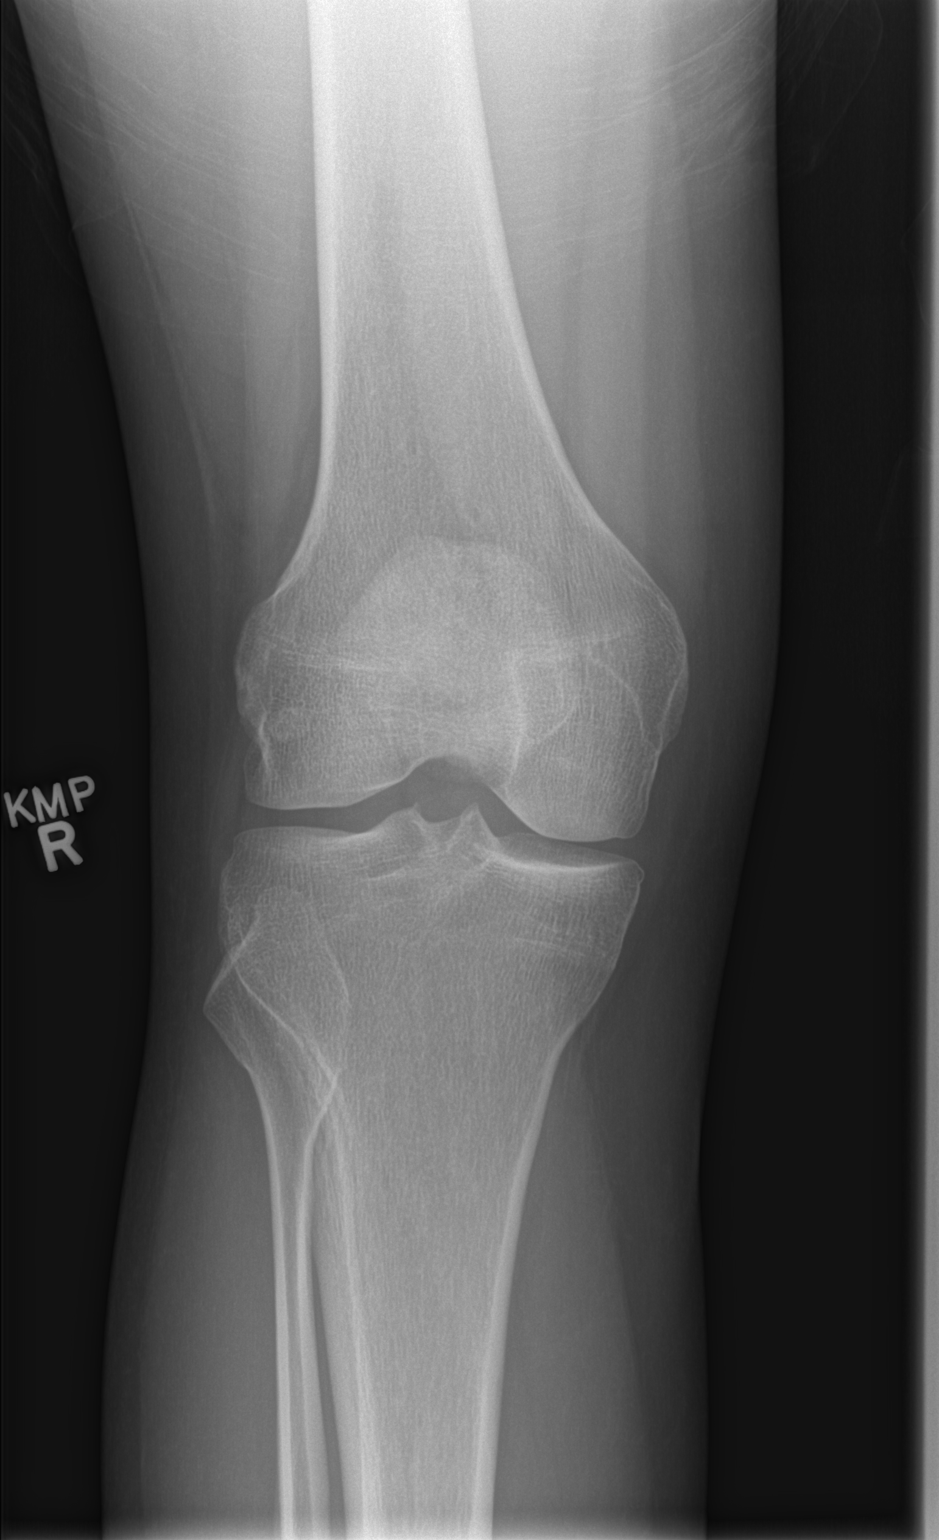

[t knee obl right (1 of 2)]
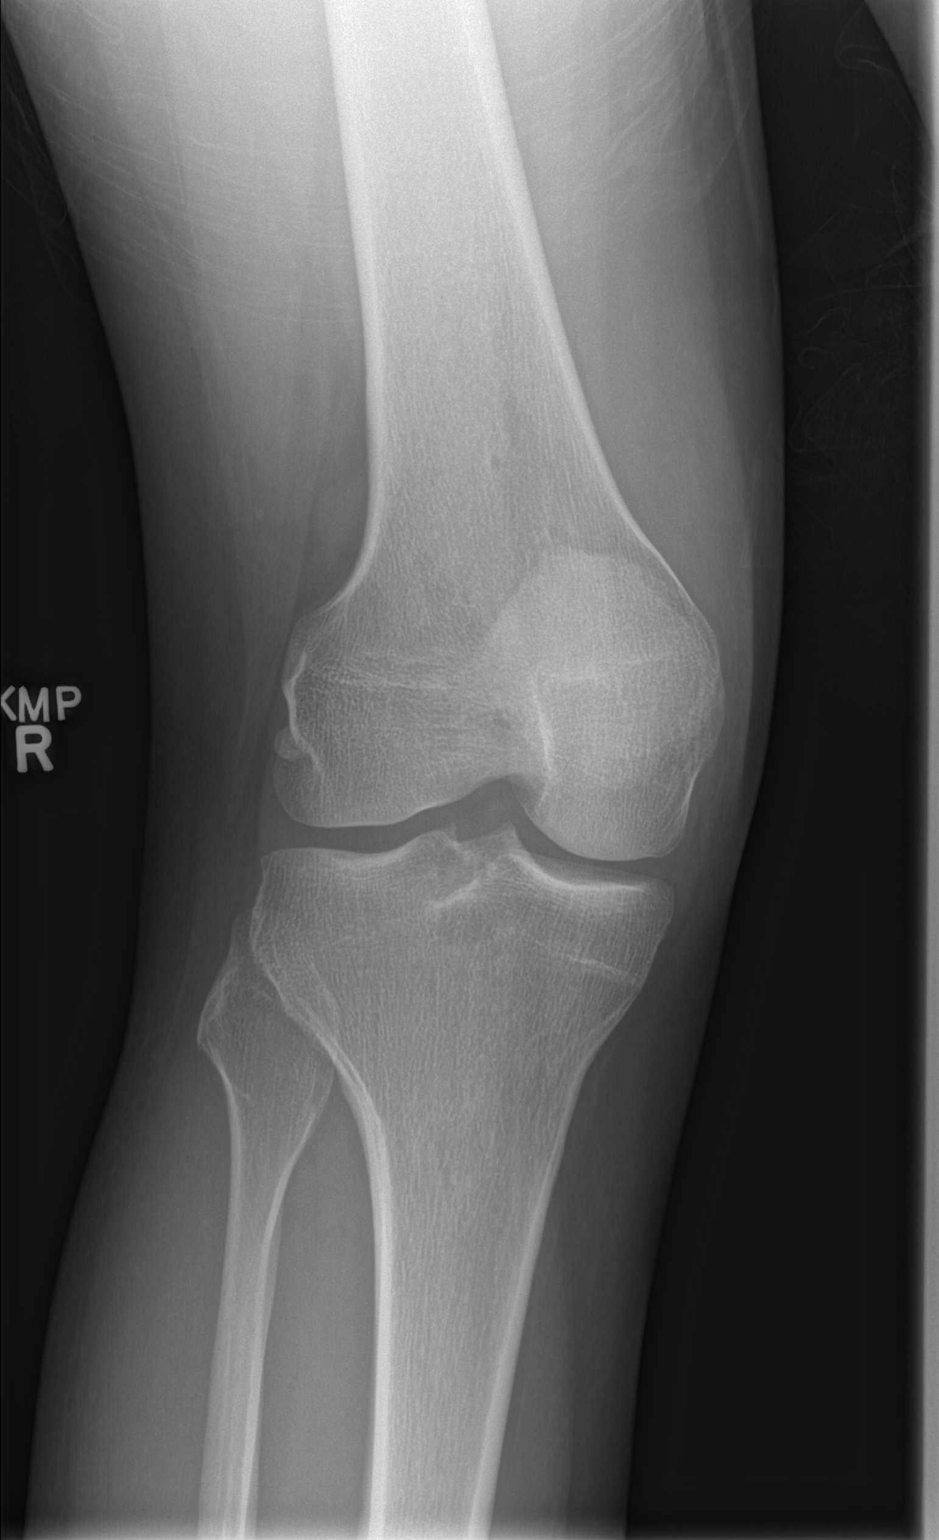

[t knee obl right (2 of 2)]
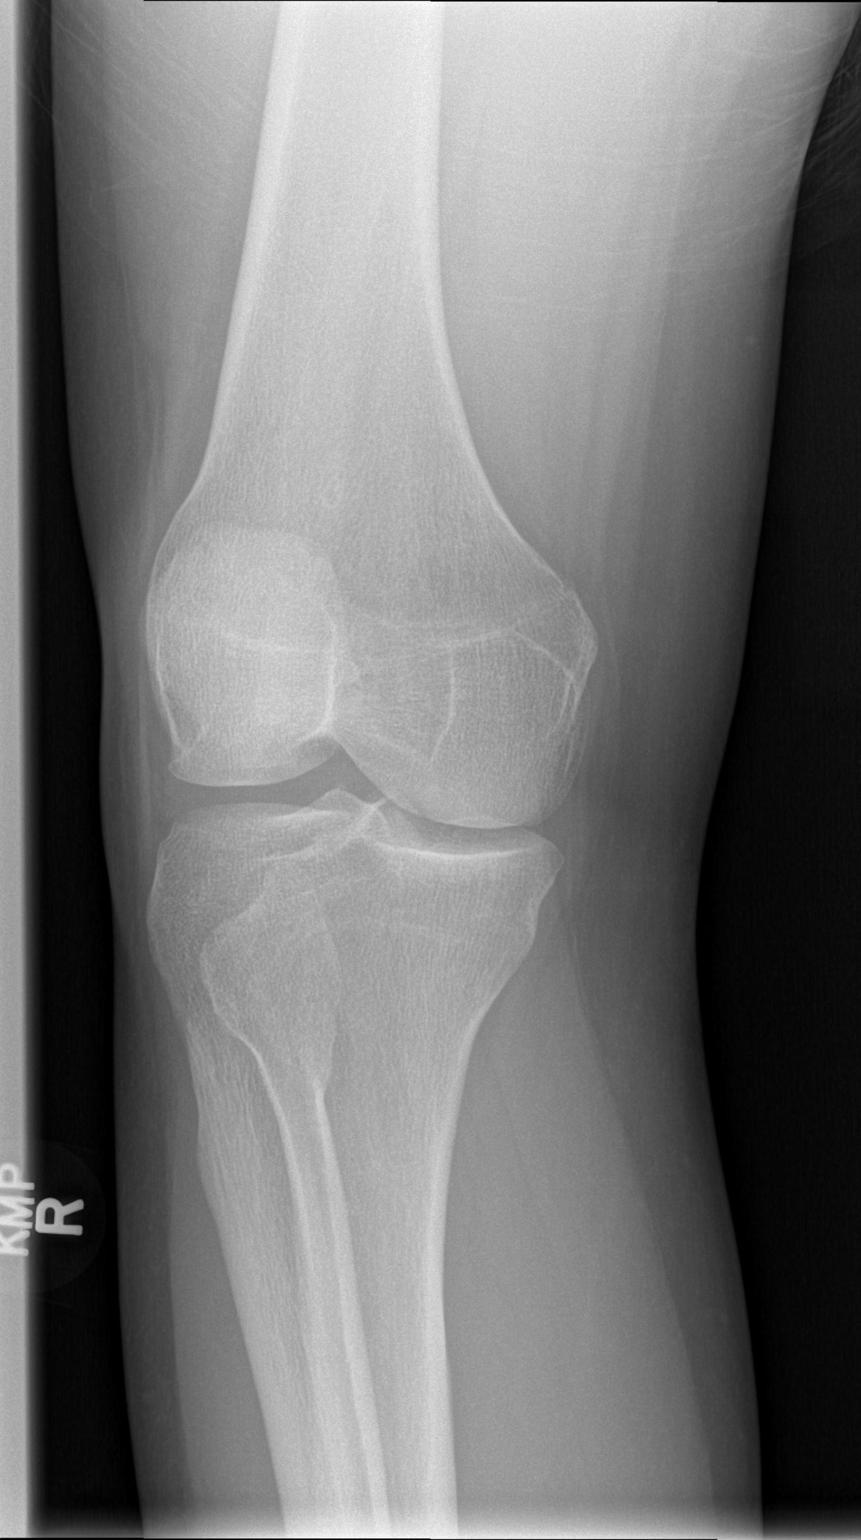

[t knee lat right]
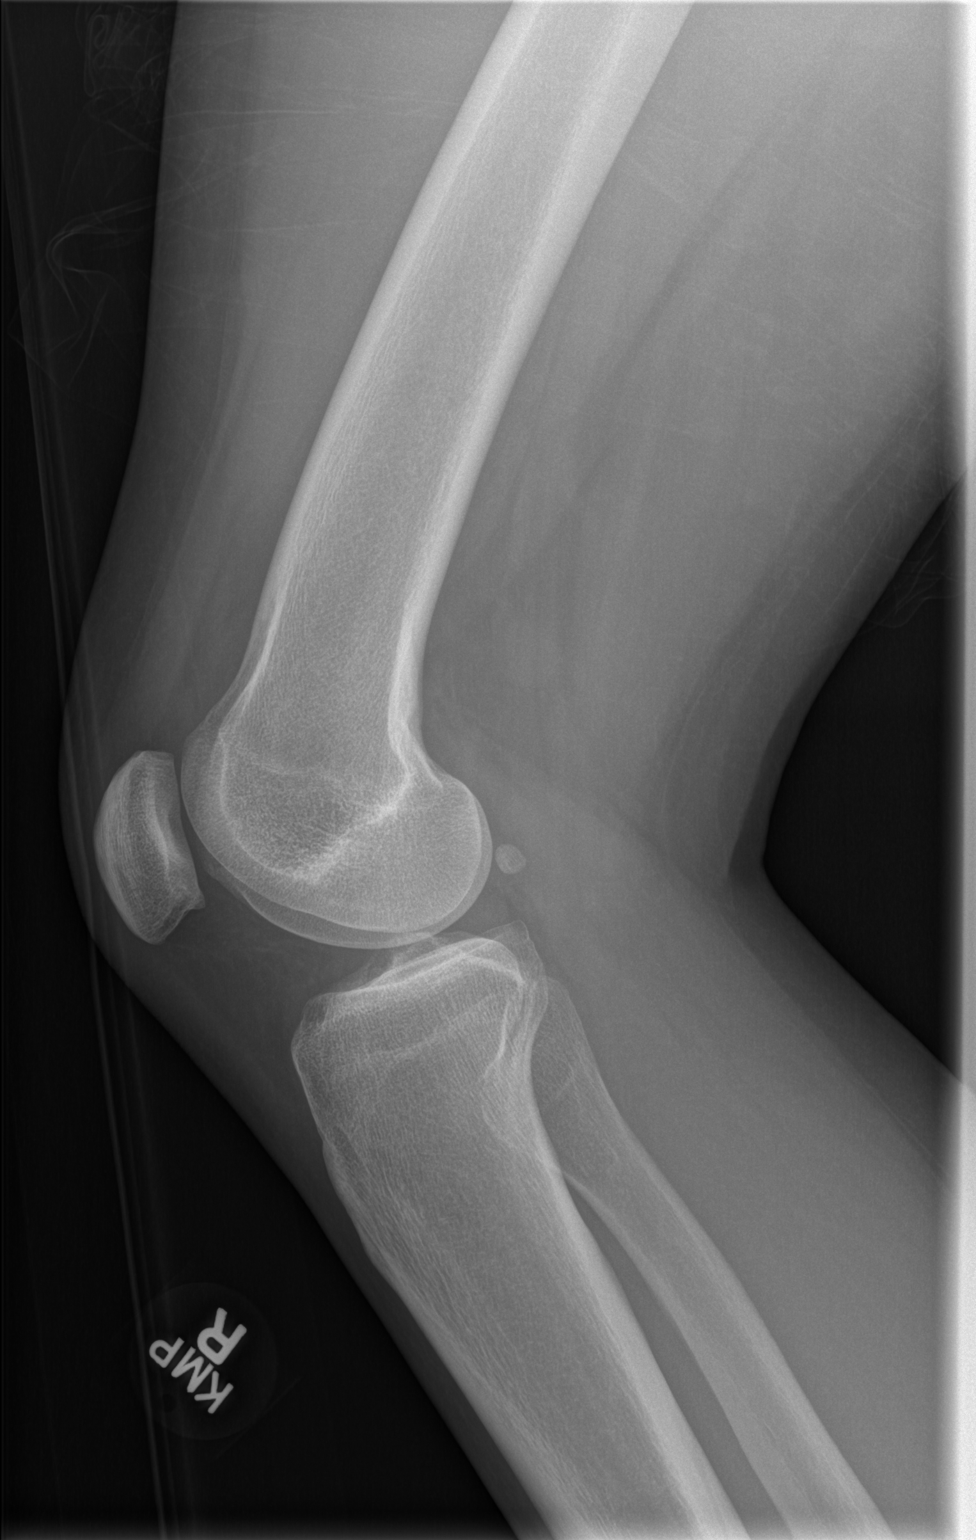

[4 of 4 positions shown; findings below may reference images not displayed]

FINDINGS: There is no evidence of fracture, dislocation, or joint effusion.
There is no evidence of arthropathy or other focal bone abnormality.
Soft tissues are unremarkable.
IMPRESSION: Negative.

## 2016-07-16 ENCOUNTER — Encounter: Payer: Self-pay | Admitting: Family

## 2016-07-16 ENCOUNTER — Ambulatory Visit (INDEPENDENT_AMBULATORY_CARE_PROVIDER_SITE_OTHER): Payer: BLUE CROSS/BLUE SHIELD | Admitting: Family

## 2016-07-16 VITALS — BP 114/70 | HR 63 | Temp 98.3°F | Resp 16 | Ht 70.0 in | Wt 179.0 lb

## 2016-07-16 DIAGNOSIS — R140 Abdominal distension (gaseous): Secondary | ICD-10-CM

## 2016-07-16 DIAGNOSIS — Z0001 Encounter for general adult medical examination with abnormal findings: Secondary | ICD-10-CM | POA: Diagnosis not present

## 2016-07-16 MED ORDER — LINACLOTIDE 72 MCG PO CAPS: 72.0000 ug | ORAL_CAPSULE | Freq: Every day | ORAL | 0 refills | Status: DC

## 2016-07-16 NOTE — Assessment & Plan Note (Signed)
1) Anticipatory Guidance: Discussed importance of wearing a seatbelt while driving and not texting while driving; changing batteries in smoke detector at least once annually; wearing suntan lotion when outside; eating a balanced and moderate diet; getting physical activity at least 30 minutes per day.  2) Immunizations / Screenings / Labs:  All immunizations are up-to-date per recommendations. Due for a dental screening encouraged to be completed independently. All other screenings including cervical cancer screening up-to-date per recommendations. Blood work previously completed and a copy has been requested.  Overall well exam with minimal risk factors for cardiovascular disease presently. Was noted to previously have slightly elevated LDL cholesterol levels. Recommend continuing physical activity and a nutritional intake that is moderate, balance, and varied. She has made positive progress in the quality of nutrition through reduction of saturated and processed/sugary foods as well as elimination of coffee. Continue healthy lifestyle behaviors and choices. Follow-up prevention exam in 1 year. Follow-up office visit as needed.

## 2016-07-16 NOTE — Assessment & Plan Note (Signed)
New-onset abdominal bloating with concern for constipation or possible irritable bowel syndrome. No acute red flags noted. Recommend over-the-counter medications as needed for constipation relief. Sample of Linzess provided if symptoms do not improve. Recommend probiotic and gaviscon as needed. Continue with fiber and water intake.

## 2016-07-16 NOTE — Patient Instructions (Addendum)
Thank you for choosing [facility].  SUMMARY AND INSTRUCTIONS:  Start a probiotic like Activia, Align or Culturelle  Gaviscon for bloating.  For constipation trial Magnesium citrate, Miralax, Duculox products. If no improvement trial Linzess provided.   Medication:  Your prescription(s) have been submitted to your pharmacy or been printed and provided for you. Please take as directed and contact our office if you believe you are having problem(s) with the medication(s) or have any questions.  Follow up:  If your symptoms worsen or fail to improve, please contact our office for further instruction, or in case of emergency go directly to the emergency room at the closest medical facility.   Health Maintenance, Female Adopting a healthy lifestyle and getting preventive care can go a long way to promote health and wellness. Talk with your health care provider about what schedule of regular examinations is right for you. This is a good chance for you to check in with your provider about disease prevention and staying healthy. In between checkups, there are plenty of things you can do on your own. Experts have done a lot of research about which lifestyle changes and preventive measures are most likely to keep you healthy. Ask your health care provider for more information. Weight and diet Eat a healthy diet  Be sure to include plenty of vegetables, fruits, low-fat dairy products, and lean protein.  Do not eat a lot of foods high in solid fats, added sugars, or salt.  Get regular exercise. This is one of the most important things you can do for your health.  Most adults should exercise for at least 150 minutes each week. The exercise should increase your heart rate and make you sweat (moderate-intensity exercise).  Most adults should also do strengthening exercises at least twice a week. This is in addition to the moderate-intensity exercise. Maintain a healthy weight  Body mass  index (BMI) is a measurement that can be used to identify possible weight problems. It estimates body fat based on height and weight. Your health care provider can help determine your BMI and help you achieve or maintain a healthy weight.  For females 60 years of age and older:  A BMI below 18.5 is considered underweight.  A BMI of 18.5 to 24.9 is normal.  A BMI of 25 to 29.9 is considered overweight.  A BMI of 30 and above is considered obese. Watch levels of cholesterol and blood lipids  You should start having your blood tested for lipids and cholesterol at 27 years of age, then have this test every 5 years.  You may need to have your cholesterol levels checked more often if:  Your lipid or cholesterol levels are high.  You are older than 27 years of age.  You are at high risk for heart disease. Cancer screening Lung Cancer  Lung cancer screening is recommended for adults 45-47 years old who are at high risk for lung cancer because of a history of smoking.  A yearly low-dose CT scan of the lungs is recommended for people who:  Currently smoke.  Have quit within the past 15 years.  Have at least a 30-pack-year history of smoking. A pack year is smoking an average of one pack of cigarettes a day for 1 year.  Yearly screening should continue until it has been 15 years since you quit.  Yearly screening should stop if you develop a health problem that would prevent you from having lung cancer treatment. Breast Cancer  Practice  means understanding how your breasts normally appear and feel.  It also means doing regular breast self-exams. Let your health care provider know about any changes, no matter how small.  If you are in your 20s or 30s, you should have a clinical breast exam (CBE) by a health care provider every 1-3 years as part of a regular health exam.  If you are 40 or older, have a CBE every year. Also consider having a breast X-ray (mammogram)  every year.  If you have a family history of breast cancer, talk to your health care provider about genetic screening.  If you are at high risk for breast cancer, talk to your health care provider about having an MRI and a mammogram every year.  Breast cancer gene (BRCA) assessment is recommended for women who have family members with BRCA-related cancers. BRCA-related cancers include:  Breast.  Ovarian.  Tubal.  Peritoneal cancers.  Results of the assessment will determine the need for genetic counseling and BRCA1 and BRCA2 testing. Cervical Cancer  Your health care provider may recommend that you be screened regularly for cancer of the pelvic organs (ovaries, uterus, and vagina). This screening involves a pelvic examination, including checking for microscopic changes to the surface of your cervix (Pap test). You may be encouraged to have this screening done every 3 years, beginning at age 21.  For women ages 30-65, health care providers may recommend pelvic exams and Pap testing every 3 years, or they may recommend the Pap and pelvic exam, combined with testing for human papilloma virus (HPV), every 5 years. Some types of HPV increase your risk of cervical cancer. Testing for HPV may also be done on women of any age with unclear Pap test results.  Other health care providers may not recommend any screening for nonpregnant women who are considered low risk for pelvic cancer and who do not have symptoms. Ask your health care provider if a screening pelvic exam is right for you.  If you have had past treatment for cervical cancer or a condition that could lead to cancer, you need Pap tests and screening for cancer for at least 20 years after your treatment. If Pap tests have been discontinued, your risk factors (such as having a new sexual partner) need to be reassessed to determine if screening should resume. Some women have medical problems that increase the chance of getting cervical  cancer. In these cases, your health care provider may recommend more frequent screening and Pap tests. Colorectal Cancer  This type of cancer can be detected and often prevented.  Routine colorectal cancer screening usually begins at 27 years of age and continues through 27 years of age.  Your health care provider may recommend screening at an earlier age if you have risk factors for colon cancer.  Your health care provider may also recommend using home test kits to check for hidden blood in the stool.  A small camera at the end of a tube can be used to examine your colon directly (sigmoidoscopy or colonoscopy). This is done to check for the earliest forms of colorectal cancer.  Routine screening usually begins at age 50.  Direct examination of the colon should be repeated every 5-10 years through 27 years of age. However, you may need to be screened more often if early forms of precancerous polyps or small growths are found. Skin Cancer  Check your skin from head to toe regularly.  Tell your health care provider about any new moles or   changes in moles, especially if there is a change in a mole's shape or color.  Also tell your health care provider if you have a mole that is larger than the size of a pencil eraser.  Always use sunscreen. Apply sunscreen liberally and repeatedly throughout the day.  Protect yourself by wearing long sleeves, pants, a wide-brimmed hat, and sunglasses whenever you are outside. Heart disease, diabetes, and high blood pressure  High blood pressure causes heart disease and increases the risk of stroke. High blood pressure is more likely to develop in:  People who have blood pressure in the high end of the normal range (130-139/85-89 mm Hg).  People who are overweight or obese.  People who are African American.  If you are 18-39 years of age, have your blood pressure checked every 3-5 years. If you are 40 years of age or older, have your blood pressure  checked every year. You should have your blood pressure measured twice-once when you are at a hospital or clinic, and once when you are not at a hospital or clinic. Record the average of the two measurements. To check your blood pressure when you are not at a hospital or clinic, you can use:  An automated blood pressure machine at a pharmacy.  A home blood pressure monitor.  If you are between 55 years and 79 years old, ask your health care provider if you should take aspirin to prevent strokes.  Have regular diabetes screenings. This involves taking a blood sample to check your fasting blood sugar level.  If you are at a normal weight and have a low risk for diabetes, have this test once every three years after 27 years of age.  If you are overweight and have a high risk for diabetes, consider being tested at a younger age or more often. Preventing infection Hepatitis B  If you have a higher risk for hepatitis B, you should be screened for this virus. You are considered at high risk for hepatitis B if:  You were born in a country where hepatitis B is common. Ask your health care provider which countries are considered high risk.  Your parents were born in a high-risk country, and you have not been immunized against hepatitis B (hepatitis B vaccine).  You have HIV or AIDS.  You use needles to inject street drugs.  You live with someone who has hepatitis B.  You have had sex with someone who has hepatitis B.  You get hemodialysis treatment.  You take certain medicines for conditions, including cancer, organ transplantation, and autoimmune conditions. Hepatitis C  Blood testing is recommended for:  Everyone born from 1945 through 1965.  Anyone with known risk factors for hepatitis C. Sexually transmitted infections (STIs)  You should be screened for sexually transmitted infections (STIs) including gonorrhea and chlamydia if:  You are sexually active and are younger than 27  years of age.  You are older than 27 years of age and your health care provider tells you that you are at risk for this type of infection.  Your sexual activity has changed since you were last screened and you are at an increased risk for chlamydia or gonorrhea. Ask your health care provider if you are at risk.  If you do not have HIV, but are at risk, it may be recommended that you take a prescription medicine daily to prevent HIV infection. This is called pre-exposure prophylaxis (PrEP). You are considered at risk if:  You are sexually   You are sexually active and do not regularly use condoms or know the HIV status of your partner(s).  You take drugs by injection.  You are sexually active with a partner who has HIV. Talk with your health care provider about whether you are at high risk of being infected with HIV. If you choose to begin PrEP, you should first be tested for HIV. You should then be tested every 3 months for as long as you are taking PrEP. Pregnancy  If you are premenopausal and you may become pregnant, ask your health care provider about preconception counseling.  If you may become pregnant, take 400 to 800 micrograms (mcg) of folic acid every day.  If you want to prevent pregnancy, talk to your health care provider about birth control (contraception). Osteoporosis and menopause  Osteoporosis is a disease in which the bones lose minerals and strength with aging. This can result in serious bone fractures. Your risk for osteoporosis can be identified using a bone density scan.  If you are 40 years of age or older, or if you are at risk for osteoporosis and fractures, ask your health care provider if you should be screened.  Ask your health care provider whether you should take a calcium or vitamin D supplement to lower your risk for osteoporosis.  Menopause may have certain physical symptoms and risks.  Hormone replacement therapy may reduce some of these symptoms and risks. Talk  to your health care provider about whether hormone replacement therapy is right for you. Follow these instructions at home:  Schedule regular health, dental, and eye exams.  Stay current with your immunizations.  Do not use any tobacco products including cigarettes, chewing tobacco, or electronic cigarettes.  If you are pregnant, do not drink alcohol.  If you are breastfeeding, limit how much and how often you drink alcohol.  Limit alcohol intake to no more than 1 drink per day for nonpregnant women. One drink equals 12 ounces of beer, 5 ounces of wine, or 1 ounces of hard liquor.  Do not use street drugs.  Do not share needles.  Ask your health care provider for help if you need support or information about quitting drugs.  Tell your health care provider if you often feel depressed.  Tell your health care provider if you have ever been abused or do not feel safe at home. This information is not intended to replace advice given to you by your health care provider. Make sure you discuss any questions you have with your health care provider. Document Released: 09/02/2010 Document Revised: 07/26/2015 Document Reviewed: 11/21/2014 Elsevier Interactive Patient Education  2017 [employer].

## 2016-07-16 NOTE — Progress Notes (Signed)
Subjective:    Patient ID: Debra Fox, female    DOB: 10-17-1990, 26 y.o.   MRN: [id number]  Chief Complaint  Patient presents with  . CPE    not fasting, stomach issues     HPI:  Debra Fox is a 26 y.o. female who presents today for an annual wellness visit.   1) Health Maintenance -   Diet - Averages about 3 meals per day consisting of a regular diet; Working on lowering gluten; Caffeine 1-2 times per week.   Exercise - 4-5 days per week for about 45 minutes.    2) Preventative Exams / Immunizations:  Dental -- Due for exam   Vision -- Up to date   Health Maintenance  Topic Date Due  . INFLUENZA VACCINE  10/01/2016  . PAP SMEAR  06/04/2017  . TETANUS/TDAP  09/29/2023  . HIV Screening  Completed    Immunization History  Administered Date(s) Administered  . Tdap 09/28/2013   3.) Stomach issues - This is a new problem. Associated symptoms bloating and constipation have been going on for the last couple of years that has been refractory to dietary changes of increasing fiber and water and has beenth. Frequency of bowel movements is averaging about a bowel movement every other day. Continues to have the bloating and gas feelings. Does have cramping at times. Bloating is aggravated by eating. Occasional diarrhea. No nausea or vomiting. No blood in stool.   No Known Allergies   Outpatient Medications Prior to Visit  Medication Sig Dispense Refill  . ondansetron (ZOFRAN ODT) 4 MG disintegrating tablet Take 1 tablet (4 mg total) by mouth every 8 (eight) hours as needed for nausea or vomiting. 20 tablet 0   No facility-administered medications prior to visit.      Past Medical History:  Diagnosis Date  . ACL tear 05/2014   right  . Bucket-handle tear of medial meniscus of right knee as current injury 05/19/2014  . Complete tear of right ACL 05/19/2014  . Derangement of posterior horn of medial meniscus of right knee 05/2014     Past Surgical History:  Procedure  Laterality Date  . ARTHROSCOPY WITH ANTERIOR CRUCIATE LIGAMENT (ACL) REPAIR WITH ANTERIOR TIBILIAS GRAFT Right 05/19/2014   Procedure: RIGHT KNEE ARTHROSCOPY MEDIAL MENISCECTOMY WITH ANTERIOR CRUCIATE LIGAMENT (ACL) TIBIAL ANTERIOR ALLOGRAFT;  Surgeon: Teryl Lucy, MD;  Location: [facility];  Service: Orthopedics;  Laterality: Right;  . MENISCUS REPAIR  05/19/2014   Procedure: REPAIR OF MENISCUS;  Surgeon: Teryl Lucy, MD;  Location: [facility];  Service: Orthopedics;;  . NO PAST SURGERIES       Family History  Problem Relation Age of Onset  . Healthy Mother   . Heart attack Maternal Grandmother   . Cancer Maternal Grandfather   . Healthy Paternal Grandmother   . Healthy Paternal Grandfather      Social History   Social History  . Marital status: Married    Spouse name: N/A  . Number of children: 2  . Years of education: 16   Occupational History  . [employer]    Social History Main Topics  . Smoking status: Never Smoker  . Smokeless tobacco: Never Used  . Alcohol use Yes     Comment: occasionally  . Drug use: No  . Sexual activity: Yes    Birth control/ protection: None   Other Topics Concern  . Not on file   Social History Narrative  Fun: Sleep   Denies religious beliefs effecting health care.   Denies abuse and feels safe at home.       Review of Systems  Constitutional: Denies fever, chills, fatigue, or significant weight gain/loss. HENT: Head: Denies headache or neck pain Ears: Denies changes in hearing, ringing in ears, earache, drainage Nose: Denies discharge, stuffiness, itching, nosebleed, sinus pain Throat: Denies sore throat, hoarseness, dry mouth, sores, thrush Eyes: Denies loss/changes in vision, pain, redness, blurry/double vision, flashing lights Cardiovascular: Denies chest pain/discomfort, tightness, palpitations, shortness of breath with activity, difficulty lying down, swelling, sudden awakening  with shortness of breath Respiratory: Denies shortness of breath, cough, sputum production, wheezing Gastrointestinal: Denies dysphasia, heartburn, change in appetite, nausea, change in bowel habits, rectal bleeding, constipation, diarrhea, yellow skin or eyes Genitourinary: Denies frequency, urgency, burning/pain, blood in urine, incontinence, change in urinary strength. Musculoskeletal: Denies muscle/joint pain, stiffness, back pain, redness or swelling of joints, trauma Skin: Denies rashes, lumps, itching, dryness, color changes, or hair/nail changes Neurological: Denies dizziness, fainting, seizures, weakness, numbness, tingling, tremor Psychiatric - Denies nervousness, stress, depression or memory loss Endocrine: Denies heat or cold intolerance, sweating, frequent urination, excessive thirst, changes in appetite Hematologic: Denies ease of bruising or bleeding     Objective:     BP 114/70 (BP Location: Left Arm, Patient Position: Sitting, Cuff Size: Normal)   Pulse 63   Temp 98.3 F (36.8 C) (Oral)   Resp 16   Ht 5\' 10"  (1.778 m)   Wt 179 lb (81.2 kg)   SpO2 98%   BMI 25.68 kg/m  Nursing note and vital signs reviewed.  Physical Exam  Constitutional: She is oriented to person, place, and time. She appears well-developed and well-nourished.  HENT:  Head: Normocephalic.  Right Ear: Hearing, tympanic membrane, external ear and ear canal normal.  Left Ear: Hearing, tympanic membrane, external ear and ear canal normal.  Nose: Nose normal.  Mouth/Throat: Uvula is midline, oropharynx is clear and moist and mucous membranes are normal.  Eyes: Conjunctivae and EOM are normal. Pupils are equal, round, and reactive to light.  Neck: Neck supple. No JVD present. No tracheal deviation present. No thyromegaly present.  Cardiovascular: Normal rate, regular rhythm, normal heart sounds and intact distal pulses.   Pulmonary/Chest: Effort normal and breath sounds normal.  Abdominal: Soft.  Bowel sounds are normal. She exhibits no distension and no mass. There is tenderness in the right lower quadrant, suprapubic area and left lower quadrant. There is no rigidity, no rebound, no guarding, no tenderness at McBurney's point and negative Murphy's sign.  Musculoskeletal: Normal range of motion. She exhibits no edema or tenderness.  Lymphadenopathy:    She has no cervical adenopathy.  Neurological: She is alert and oriented to person, place, and time. She has normal reflexes. No cranial nerve deficit. She exhibits normal muscle tone. Coordination normal.  Skin: Skin is warm and dry.  Psychiatric: She has a normal mood and affect. Her behavior is normal. Judgment and thought content normal.       Assessment & Plan:   Problem List Items Addressed This Visit      Other   Encounter for general adult medical examination with abnormal findings - Primary    1) Anticipatory Guidance: Discussed importance of wearing a seatbelt while driving and not texting while driving; changing batteries in smoke detector at least once annually; wearing suntan lotion when outside; eating a balanced and moderate diet; getting physical activity at least 30 minutes per day.  2)  Immunizations / Screenings / Labs:  All immunizations are up-to-date per recommendations. Due for a dental screening encouraged to be completed independently. All other screenings including cervical cancer screening up-to-date per recommendations. Blood work previously completed and a copy has been requested.  Overall well exam with minimal risk factors for cardiovascular disease presently. Was noted to previously have slightly elevated LDL cholesterol levels. Recommend continuing physical activity and a nutritional intake that is moderate, balance, and varied. She has made positive progress in the quality of nutrition through reduction of saturated and processed/sugary foods as well as elimination of coffee. Continue healthy lifestyle  behaviors and choices. Follow-up prevention exam in 1 year. Follow-up office visit as needed.      Abdominal bloating    New-onset abdominal bloating with concern for constipation or possible irritable bowel syndrome. No acute red flags noted. Recommend over-the-counter medications as needed for constipation relief. Sample of Linzess provided if symptoms do not improve. Recommend probiotic and gaviscon as needed. Continue with fiber and water intake.          I have discontinued Ms. Juliana's ondansetron. I am also having her start on linaclotide.   Meds ordered this encounter  Medications  . linaclotide (LINZESS) 72 MCG capsule    Sig: Take 1 capsule (72 mcg total) by mouth daily before breakfast.    Dispense:  4 capsule    Refill:  0    Order Specific Question:   Supervising Provider    Answer:   Hillard Danker A [[id number]]     Follow-up: Return in about 1 month (around 08/16/2016), or if symptoms worsen or fail to improve.   Jeanine Luz, FNP

## 2016-08-12 ENCOUNTER — Telehealth: Payer: Self-pay | Admitting: Family

## 2016-08-12 NOTE — Telephone Encounter (Signed)
Pt would like a letter for her work stating she needs a standing desk for her back. And will need Gregs signature on it  She will pick up when ready.

## 2016-08-18 ENCOUNTER — Encounter: Payer: Self-pay | Admitting: Family

## 2016-08-18 NOTE — Telephone Encounter (Signed)
Letter written and available for pick up. 

## 2016-08-19 NOTE — Telephone Encounter (Signed)
Pt.notified

## 2017-02-20 ENCOUNTER — Encounter: Payer: Self-pay | Admitting: Family

## 2017-02-20 ENCOUNTER — Ambulatory Visit (INDEPENDENT_AMBULATORY_CARE_PROVIDER_SITE_OTHER): Payer: BLUE CROSS/BLUE SHIELD | Admitting: Family

## 2017-02-20 ENCOUNTER — Other Ambulatory Visit: Payer: BLUE CROSS/BLUE SHIELD

## 2017-02-20 VITALS — BP 106/70 | HR 74 | Temp 98.3°F | Ht 70.0 in | Wt 177.0 lb

## 2017-02-20 DIAGNOSIS — R350 Frequency of micturition: Secondary | ICD-10-CM

## 2017-02-20 DIAGNOSIS — K5900 Constipation, unspecified: Secondary | ICD-10-CM

## 2017-02-20 DIAGNOSIS — N3000 Acute cystitis without hematuria: Secondary | ICD-10-CM | POA: Diagnosis not present

## 2017-02-20 LAB — POC URINALSYSI DIPSTICK (AUTOMATED)
Bilirubin, UA: NEGATIVE
GLUCOSE UA: NEGATIVE
Nitrite, UA: NEGATIVE
Spec Grav, UA: 1.025 (ref 1.010–1.025)
Urobilinogen, UA: 0.2 E.U./dL
pH, UA: 6 (ref 5.0–8.0)

## 2017-02-20 MED ORDER — SULFAMETHOXAZOLE-TRIMETHOPRIM 800-160 MG PO TABS: 1.0000 | ORAL_TABLET | Freq: Two times a day (BID) | ORAL | 0 refills | Status: DC

## 2017-02-20 MED ORDER — LINACLOTIDE 145 MCG PO CAPS: 145.0000 ug | ORAL_CAPSULE | Freq: Every day | ORAL | 1 refills | Status: DC

## 2017-02-20 NOTE — Progress Notes (Signed)
Debra Fox is a 26 y.o. female with the following history as recorded in EpicCare:  Patient Active Problem List   Diagnosis Date Noted  . Encounter for general adult medical examination with abnormal findings 07/16/2016  . Abdominal bloating 07/16/2016  . Routine general medical examination at a health care facility 11/21/2014  . Fatigue 09/18/2014  . Complete tear of right ACL 05/19/2014  . Bucket-handle tear of medial meniscus of right knee as current injury 05/19/2014    Current Outpatient Medications  Medication Sig Dispense Refill  . linaclotide (LINZESS) 145 MCG CAPS capsule Take 1 capsule (145 mcg total) by mouth daily before breakfast. 30 capsule 1  . sulfamethoxazole-trimethoprim (BACTRIM DS,SEPTRA DS) 800-160 MG tablet Take 1 tablet by mouth 2 (two) times daily. 10 tablet 0   No current facility-administered medications for this visit.     Allergies: Patient has no known allergies.  Past Medical History:  Diagnosis Date  . ACL tear 05/2014   right  . Bucket-handle tear of medial meniscus of right knee as current injury 05/19/2014  . Complete tear of right ACL 05/19/2014  . Derangement of posterior horn of medial meniscus of right knee 05/2014    Past Surgical History:  Procedure Laterality Date  . ARTHROSCOPY WITH ANTERIOR CRUCIATE LIGAMENT (ACL) REPAIR WITH ANTERIOR TIBILIAS GRAFT Right 05/19/2014   Procedure: RIGHT KNEE ARTHROSCOPY MEDIAL MENISCECTOMY WITH ANTERIOR CRUCIATE LIGAMENT (ACL) TIBIAL ANTERIOR ALLOGRAFT;  Surgeon: Teryl LucyJoshua Landau, MD;  Location: [facility];  Service: Orthopedics;  Laterality: Right;  . MENISCUS REPAIR  05/19/2014   Procedure: REPAIR OF MENISCUS;  Surgeon: Teryl LucyJoshua Landau, MD;  [facility];  Service: Orthopedics;;  . NO PAST SURGERIES      Family History  Problem Relation Age of Onset  . Healthy Mother   . Heart attack Maternal Grandmother   . Cancer Maternal Grandfather   . Healthy Paternal Grandmother   .  Healthy Paternal Grandfather     Social History   Tobacco Use  . Smoking status: Never Smoker  . Smokeless tobacco: Never Used  Substance Use Topics  . Alcohol use: Yes    Comment: occasionally    Subjective:  Patient presents with concern for possible UTI; 2 day history of urgency, burning and frequency; denies any fever or blood in her urine; has had UTI in the past; no history of pyelonephritis; Also requesting refill on the Linzess she was prescribed earlier this year for chronic constipation; has been doing better with dietary changes but would like updated prescription.   Objective:  Vitals:   02/20/17 1543  BP: 106/70  Pulse: 74  Temp: 98.3 F (36.8 C)  TempSrc: Oral  SpO2: 96%  Weight: 177 lb (80.3 kg)  Height: 5\' 10"  (1.778 m)    General: Well developed, well nourished, in no acute distress  Skin : Warm and dry.  Head: Normocephalic and atraumatic  Eyes: Sclera and conjunctiva clear; pupils round and reactive to light; extraocular movements intact  Lungs: Respirations unlabored; clear to auscultation bilaterally without wheeze, rales, rhonchi  Musculoskeletal: No deformities; no active joint inflammation; negative CVA tenderness Extremities: No edema, cyanosis, clubbing  Vessels: Symmetric bilaterally  Neurologic: Alert and oriented; speech intact; face symmetrical; moves all extremities well; CNII-XII intact without focal deficit  Assessment:  1. Acute cystitis without hematuria   2. Urinary frequency   3. Constipation, unspecified constipation type     Plan:  1. & 2. Check U/A and urine culture; Rx for  Bactrim DS bid x 5 days; follow-up worse, no better. 3. Refill given on Linzess 145 #30/ 1 rf; she is encouraged to follow-up for CPE/establish care with Hurman HornAshleigh Shambleigh for continued refills.   No Follow-up on file.  Orders Placed This Encounter  Procedures  . CULTURE, URINE COMPREHENSIVE    Standing Status:   Future    Standing Expiration Date:    02/20/2018  . POCT Urinalysis Dipstick (Automated)    Requested Prescriptions   Signed Prescriptions Disp Refills  . linaclotide (LINZESS) 145 MCG CAPS capsule 30 capsule 1    Sig: Take 1 capsule (145 mcg total) by mouth daily before breakfast.  . sulfamethoxazole-trimethoprim (BACTRIM DS,SEPTRA DS) 800-160 MG tablet 10 tablet 0    Sig: Take 1 tablet by mouth 2 (two) times daily.

## 2017-02-20 NOTE — Patient Instructions (Signed)

## 2017-02-24 LAB — CULTURE, URINE COMPREHENSIVE
MICRO NUMBER: 81439695
SPECIMEN QUALITY: ADEQUATE

## 2017-04-15 ENCOUNTER — Ambulatory Visit (INDEPENDENT_AMBULATORY_CARE_PROVIDER_SITE_OTHER): Payer: BLUE CROSS/BLUE SHIELD | Admitting: Family

## 2017-04-15 ENCOUNTER — Encounter: Payer: Self-pay | Admitting: Family

## 2017-04-15 VITALS — BP 108/72 | HR 62 | Temp 98.8°F | Ht 70.0 in | Wt 182.1 lb

## 2017-04-15 DIAGNOSIS — R5383 Other fatigue: Secondary | ICD-10-CM | POA: Diagnosis not present

## 2017-04-15 DIAGNOSIS — F418 Other specified anxiety disorders: Secondary | ICD-10-CM

## 2017-04-15 MED ORDER — BUSPIRONE HCL 7.5 MG PO TABS: 7.5000 mg | ORAL_TABLET | Freq: Two times a day (BID) | ORAL | 0 refills | Status: DC | PRN

## 2017-04-15 NOTE — Progress Notes (Signed)
Debra Fox is a 27 y.o. female with the following history as recorded in EpicCare:  Patient Active Problem List   Diagnosis Date Noted  . Encounter for general adult medical examination with abnormal findings 07/16/2016  . Abdominal bloating 07/16/2016  . Routine general medical examination at a health care facility 11/21/2014  . Fatigue 09/18/2014  . Complete tear of right ACL 05/19/2014  . Bucket-handle tear of medial meniscus of right knee as current injury 05/19/2014    Current Outpatient Medications  Medication Sig Dispense Refill  . busPIRone (BUSPAR) 7.5 MG tablet Take 1 tablet (7.5 mg total) by mouth 2 (two) times daily as needed. 60 tablet 0   No current facility-administered medications for this visit.     Allergies: Patient has no known allergies.  Past Medical History:  Diagnosis Date  . ACL tear 05/2014   right  . Bucket-handle tear of medial meniscus of right knee as current injury 05/19/2014  . Complete tear of right ACL 05/19/2014  . Derangement of posterior horn of medial meniscus of right knee 05/2014    Past Surgical History:  Procedure Laterality Date  . ARTHROSCOPY WITH ANTERIOR CRUCIATE LIGAMENT (ACL) REPAIR WITH ANTERIOR TIBILIAS GRAFT Right 05/19/2014   Procedure: RIGHT KNEE ARTHROSCOPY MEDIAL MENISCECTOMY WITH ANTERIOR CRUCIATE LIGAMENT (ACL) TIBIAL ANTERIOR ALLOGRAFT;  Surgeon: Teryl LucyJoshua Landau, MD;  Location: [facility];  Service: Orthopedics;  Laterality: Right;  . MENISCUS REPAIR  05/19/2014   Procedure: REPAIR OF MENISCUS;  Surgeon: Teryl LucyJoshua Landau, MD;  Location: [facility];  Service: Orthopedics;;  . NO PAST SURGERIES      Family History  Problem Relation Age of Onset  . Healthy Mother   . Heart attack Maternal Grandmother   . Cancer Maternal Grandfather   . Healthy Paternal Grandmother   . Healthy Paternal Grandfather     Social History   Tobacco Use  . Smoking status: Never Smoker  . Smokeless tobacco: Never Used   Substance Use Topics  . Alcohol use: Yes    Comment: occasionally    Subjective:  Patient presents with concerns for increased personal stress- dealing with custody issues with ex-husband and changes at her job; feels like she is sleeping "too much"-don't want to get up in the mornings; eating well; history of post-partum depression; has tried Zoloft in the past- did not find medication to be very beneficial; worried about the panic attacks that come when stress gets really high; would like to discuss treatment options to use on as needed basis; prefers not to take daily medication at this time.  LMP- 2 weeks ago;     Objective:  Vitals:   04/15/17 1619  BP: 108/72  Pulse: 62  Temp: 98.8 F (37.1 C)  TempSrc: Oral  SpO2: 100%  Weight: 182 lb 1.3 oz (82.6 kg)  Height: 5\' 10"  (1.778 m)    General: Well developed, well nourished, in no acute distress  Skin : Warm and dry.  Head: Normocephalic and atraumatic  Lungs: Respirations unlabored; clear to auscultation bilaterally without wheeze, rales, rhonchi  Neurologic: Alert and oriented; speech intact; face symmetrical; moves all extremities well; CNII-XII intact without focal deficit  Limited physical exam as majority of visit spent counseling Assessment:  1. Situational anxiety   2. Other fatigue     Plan:  Trial of Buspar 7.5 mg bid prn for situational anxiety; risks and benefits discussed; follow-up in 1 month; dose can be increased if needed;  Time spent with patient  25 minutes; greater than 50% in counseling.  Return in about 1 month (around 05/13/2017) for with Apolonio Schneiders for med-check/ labs.  No orders of the defined types were placed in this encounter.   Requested Prescriptions   Signed Prescriptions Disp Refills  . busPIRone (BUSPAR) 7.5 MG tablet 60 tablet 0    Sig: Take 1 tablet (7.5 mg total) by mouth 2 (two) times daily as needed.

## 2017-05-19 ENCOUNTER — Ambulatory Visit: Payer: BLUE CROSS/BLUE SHIELD | Admitting: Nurse Practitioner

## 2017-05-19 DIAGNOSIS — Z0289 Encounter for other administrative examinations: Secondary | ICD-10-CM

## 2017-07-17 ENCOUNTER — Ambulatory Visit (INDEPENDENT_AMBULATORY_CARE_PROVIDER_SITE_OTHER): Payer: BLUE CROSS/BLUE SHIELD | Admitting: Family

## 2017-07-17 ENCOUNTER — Encounter: Payer: Self-pay | Admitting: Family

## 2017-07-17 VITALS — BP 110/60 | HR 60 | Temp 98.6°F | Ht 70.0 in | Wt 173.0 lb

## 2017-07-17 DIAGNOSIS — K5909 Other constipation: Secondary | ICD-10-CM

## 2017-07-17 DIAGNOSIS — F418 Other specified anxiety disorders: Secondary | ICD-10-CM

## 2017-07-17 MED ORDER — BUSPIRONE HCL 7.5 MG PO TABS: 15.0000 mg | ORAL_TABLET | Freq: Two times a day (BID) | ORAL | 1 refills | Status: DC | PRN

## 2017-07-17 NOTE — Progress Notes (Signed)
Debra Fox is a 27 y.o. female with the following history as recorded in EpicCare:  Patient Active Problem List   Diagnosis Date Noted  . Encounter for general adult medical examination with abnormal findings 07/16/2016  . Abdominal bloating 07/16/2016  . Routine general medical examination at a health care facility 11/21/2014  . Fatigue 09/18/2014  . Complete tear of right ACL 05/19/2014  . Bucket-handle tear of medial meniscus of right knee as current injury 05/19/2014    Current Outpatient Medications  Medication Sig Dispense Refill  . busPIRone (BUSPAR) 7.5 MG tablet Take 2 tablets (15 mg total) by mouth 2 (two) times daily as needed. 60 tablet 1   No current facility-administered medications for this visit.     Allergies: Patient has no known allergies.  Past Medical History:  Diagnosis Date  . ACL tear 05/2014   right  . Bucket-handle tear of medial meniscus of right knee as current injury 05/19/2014  . Complete tear of right ACL 05/19/2014  . Derangement of posterior horn of medial meniscus of right knee 05/2014    Past Surgical History:  Procedure Laterality Date  . ARTHROSCOPY WITH ANTERIOR CRUCIATE LIGAMENT (ACL) REPAIR WITH ANTERIOR TIBILIAS GRAFT Right 05/19/2014   Procedure: RIGHT KNEE ARTHROSCOPY MEDIAL MENISCECTOMY WITH ANTERIOR CRUCIATE LIGAMENT (ACL) TIBIAL ANTERIOR ALLOGRAFT;  Surgeon: Teryl Lucy, MD;  Location: [facility];  Service: Orthopedics;  Laterality: Right;  . MENISCUS REPAIR  05/19/2014   Procedure: REPAIR OF MENISCUS;  Surgeon: Teryl Lucy, MD;  Location: [facility];  Service: Orthopedics;;  . NO PAST SURGERIES      Family History  Problem Relation Age of Onset  . Healthy Mother   . Heart attack Maternal Grandmother   . Cancer Maternal Grandfather   . Healthy Paternal Grandmother   . Healthy Paternal Grandfather     Social History   Tobacco Use  . Smoking status: Never Smoker  . Smokeless tobacco: Never Used   Substance Use Topics  . Alcohol use: Yes    Comment: occasionally    Subjective:  Patient presents to follow-up on situational anxiety; continuing to deal with custody issues with her ex-husband; does feel that work stress is improved; has found Buspar to be beneficial for the anxiety- does not want to take daily medication at this time; planning to start working with a therapist next week; Also has chronic history of constipation- Linzess very expensive; wonders about need to see GI; Would like to update pap smear at next OV; notes that periods are and have always been very irregular; may have a period every 2-3 months; husband has had a vasectomy;   Objective:  Vitals:   07/17/17 1411  BP: 110/60  Pulse: 60  Temp: 98.6 F (37 C)  TempSrc: Oral  SpO2: 99%  Weight: 173 lb (78.5 kg)  Height:  (1.778 m)    General: Well developed, well nourished, in no acute distress  Skin : Warm and dry.  Head: Normocephalic and atraumatic  Lungs: Respirations unlabored; clear to auscultation bilaterally without wheeze, rales, rhonchi  Neurologic: Alert and oriented; speech intact; face symmetrical; moves all extremities well; CNII-XII intact without focal deficit   Assessment:  1. Situational anxiety   2. Chronic constipation     Plan:  Suspect both issues are related; will see how patient responds to therapy- may need to consider daily SSRI; encouraged fiber, water intake, Benefiber; will re-evaluate in 2 months;   Return in about 2 months (  around 09/16/2017).  No orders of the defined types were placed in this encounter.   Requested Prescriptions   Signed Prescriptions Disp Refills  . busPIRone (BUSPAR) 7.5 MG tablet 60 tablet 1    Sig: Take 2 tablets (15 mg total) by mouth 2 (two) times daily as needed.

## 2017-09-16 ENCOUNTER — Ambulatory Visit (INDEPENDENT_AMBULATORY_CARE_PROVIDER_SITE_OTHER): Payer: BLUE CROSS/BLUE SHIELD | Admitting: Family

## 2017-09-16 ENCOUNTER — Other Ambulatory Visit: Payer: BLUE CROSS/BLUE SHIELD

## 2017-09-16 ENCOUNTER — Encounter: Payer: Self-pay | Admitting: Family

## 2017-09-16 ENCOUNTER — Other Ambulatory Visit (HOSPITAL_COMMUNITY)
Admission: RE | Admit: 2017-09-16 | Discharge: 2017-09-16 | Disposition: A | Discharge status: Home / Self Care | Payer: BLUE CROSS/BLUE SHIELD | Source: Ambulatory Visit | Attending: Family | Admitting: Family

## 2017-09-16 VITALS — BP 104/68 | HR 66 | Temp 98.6°F | Ht 70.0 in | Wt 177.0 lb

## 2017-09-16 DIAGNOSIS — Z124 Encounter for screening for malignant neoplasm of cervix: Secondary | ICD-10-CM

## 2017-09-16 DIAGNOSIS — K5909 Other constipation: Secondary | ICD-10-CM | POA: Diagnosis not present

## 2017-09-16 NOTE — Patient Instructions (Signed)

## 2017-09-16 NOTE — Progress Notes (Signed)
Debra Fox is a 27 y.o. female with the following history as recorded in EpicCare:  Patient Active Problem List   Diagnosis Date Noted  . Encounter for general adult medical examination with abnormal findings 07/16/2016  . Abdominal bloating 07/16/2016  . Routine general medical examination at a health care facility 11/21/2014  . Fatigue 09/18/2014  . Complete tear of right ACL 05/19/2014  . Bucket-handle tear of medial meniscus of right knee as current injury 05/19/2014    Current Outpatient Medications  Medication Sig Dispense Refill  . busPIRone (BUSPAR) 7.5 MG tablet Take 2 tablets (15 mg total) by mouth 2 (two) times daily as needed. 60 tablet 1   No current facility-administered medications for this visit.     Allergies: Patient has no known allergies.  Past Medical History:  Diagnosis Date  . ACL tear 05/2014   right  . Bucket-handle tear of medial meniscus of right knee as current injury 05/19/2014  . Complete tear of right ACL 05/19/2014  . Derangement of posterior horn of medial meniscus of right knee 05/2014    Past Surgical History:  Procedure Laterality Date  . ARTHROSCOPY WITH ANTERIOR CRUCIATE LIGAMENT (ACL) REPAIR WITH ANTERIOR TIBILIAS GRAFT Right 05/19/2014   Procedure: RIGHT KNEE ARTHROSCOPY MEDIAL MENISCECTOMY WITH ANTERIOR CRUCIATE LIGAMENT (ACL) TIBIAL ANTERIOR ALLOGRAFT;  Surgeon: Teryl LucyJoshua Landau, MD;  Location: [facility];  Service: Orthopedics;  Laterality: Right;  . MENISCUS REPAIR  05/19/2014   Procedure: REPAIR OF MENISCUS;  Surgeon: Teryl LucyJoshua Landau, MD;  Location: [facility];  Service: Orthopedics;;  . NO PAST SURGERIES      Family History  Problem Relation Age of Onset  . Healthy Mother   . Heart attack Maternal Grandmother   . Cancer Maternal Grandfather   . Healthy Paternal Grandmother   . Healthy Paternal Grandfather     Social History   Tobacco Use  . Smoking status: Never Smoker  . Smokeless tobacco: Never Used   Substance Use Topics  . Alcohol use: Yes    Comment: occasionally    Subjective:  2 month follow-up on situational anxiety/ chronic constipation; also needs to get pap smear updated; LMP was July 8; Since last office visit, has started working with therapist and does feel beneficial; does not want to start daily medicine; would like to see GI about chronic constipation;    Objective:  Vitals:   09/16/17 0845  BP: 104/68  Pulse: 66  Temp: 98.6 F (37 C)  TempSrc: Oral  SpO2: 99%  Weight: 177 lb (80.3 kg)  Height: 5\' 10"  (1.778 m)    General: Well developed, well nourished, in no acute distress  Skin : Warm and dry.  Head: Normocephalic and atraumatic  Lungs: Respirations unlabored; Vessels: Symmetric bilaterally  Neurologic: Alert and oriented; speech intact; face symmetrical; moves all extremities well; CNII-XII intact without focal deficit  Pelvic exam: normal external genitalia, vulva, vagina, cervix, uterus and adnexa.  Assessment:  1. Cervical cancer screening   2. Chronic constipation     Plan:  1. Thin Prep pap collected;  2. Referral to GI as requested by patient;   No follow-ups on file.  Orders Placed This Encounter  Procedures  . Ambulatory referral to Gastroenterology    Referral Priority:   Routine    Referral Type:   Consultation    Referral Reason:   Specialty Services Required    Number of Visits Requested:   1    Requested Prescriptions  No prescriptions requested or ordered in this encounter

## 2017-09-17 LAB — CYTOLOGY - PAP
BACTERIAL VAGINITIS: NEGATIVE
CANDIDA VAGINITIS: NEGATIVE
DIAGNOSIS: NEGATIVE
HPV (WINDOPATH): NOT DETECTED
TRICH (WINDOWPATH): NEGATIVE

## 2017-09-18 ENCOUNTER — Telehealth: Payer: Self-pay | Admitting: Family

## 2017-09-18 NOTE — Telephone Encounter (Signed)
Patient notified- negative results- repeat 3 years    Results not in triage results

## 2017-10-30 ENCOUNTER — Encounter: Payer: Self-pay | Admitting: Family

## 2017-10-30 ENCOUNTER — Ambulatory Visit (INDEPENDENT_AMBULATORY_CARE_PROVIDER_SITE_OTHER): Payer: BLUE CROSS/BLUE SHIELD | Admitting: Family

## 2017-10-30 ENCOUNTER — Other Ambulatory Visit (INDEPENDENT_AMBULATORY_CARE_PROVIDER_SITE_OTHER): Payer: BLUE CROSS/BLUE SHIELD

## 2017-10-30 VITALS — BP 108/70 | HR 86 | Temp 98.2°F | Ht 70.0 in | Wt 174.0 lb

## 2017-10-30 DIAGNOSIS — E049 Nontoxic goiter, unspecified: Secondary | ICD-10-CM | POA: Diagnosis not present

## 2017-10-30 LAB — CBC WITH DIFFERENTIAL/PLATELET
Basophils Absolute: 0 10*3/uL (ref 0.0–0.1)
Basophils Relative: 0.3 % (ref 0.0–3.0)
EOS PCT: 0.8 % (ref 0.0–5.0)
Eosinophils Absolute: 0 10*3/uL (ref 0.0–0.7)
HCT: 40.7 % (ref 36.0–46.0)
Hemoglobin: 14 g/dL (ref 12.0–15.0)
LYMPHS ABS: 1.9 10*3/uL (ref 0.7–4.0)
Lymphocytes Relative: 35.3 % (ref 12.0–46.0)
MCHC: 34.3 g/dL (ref 30.0–36.0)
MCV: 95.2 fl (ref 78.0–100.0)
MONO ABS: 0.5 10*3/uL (ref 0.1–1.0)
Monocytes Relative: 9.1 % (ref 3.0–12.0)
NEUTROS PCT: 54.5 % (ref 43.0–77.0)
Neutro Abs: 2.9 10*3/uL (ref 1.4–7.7)
PLATELETS: 245 10*3/uL (ref 150.0–400.0)
RBC: 4.27 Mil/uL (ref 3.87–5.11)
RDW: 13 % (ref 11.5–15.5)
WBC: 5.3 10*3/uL (ref 4.0–10.5)

## 2017-10-30 LAB — COMPREHENSIVE METABOLIC PANEL
ALT: 9 U/L (ref 0–35)
AST: 12 U/L (ref 0–37)
Albumin: 4.7 g/dL (ref 3.5–5.2)
Alkaline Phosphatase: 38 U/L — ABNORMAL LOW (ref 39–117)
BUN: 10 mg/dL (ref 6–23)
CHLORIDE: 101 meq/L (ref 96–112)
CO2: 31 mEq/L (ref 19–32)
CREATININE: 0.83 mg/dL (ref 0.40–1.20)
Calcium: 9.4 mg/dL (ref 8.4–10.5)
GFR: 86.6 mL/min (ref >60.00)
GLUCOSE: 108 mg/dL — AB (ref 70–99)
POTASSIUM: 3.8 meq/L (ref 3.5–5.1)
SODIUM: 139 meq/L (ref 135–145)
TOTAL PROTEIN: 7.5 g/dL (ref 6.0–8.3)
Total Bilirubin: 0.6 mg/dL (ref 0.2–1.2)

## 2017-10-30 LAB — TSH: TSH: 1.27 u[IU]/mL (ref 0.35–4.50)

## 2017-10-30 NOTE — Patient Instructions (Signed)
Salivary Stone A salivary stone is a mineral deposit that builds up in the ducts that drain your salivary glands. Most salivary gland stones are made of calcium. When a stone forms, saliva can back up into the gland and cause painful swelling. Your salivary glands are the glands that produce spit (saliva). You have six major salivary glands. Each gland has a duct that carries saliva into your mouth. Saliva keeps your mouth moist and breaks down the food that you eat. It also helps to prevent tooth decay. Two salivary glands are located just in front of your ears (parotid). The ducts for these glands open up inside your cheeks, near your back teeth. You also have two glands under your tongue (sublingual) and two glands under your jaw (submandibular). The ducts for these glands open under your tongue. A stone can form in any salivary gland. The most common place for a salivary stone to develop is in a submandibular salivary gland. What are the causes? Any condition that reduces the flow of saliva may lead to stone formation. It is not known why some people form stones and others do not. What increases the risk? You may be more likely to develop a salivary stone if you:  Are female.  Do not drink enough water.  Smoke.  Have high blood pressure.  Have gout.  Have diabetes.  What are the signs or symptoms? The main sign of a salivary gland stone is sudden swelling of a salivary gland when eating. This usually happens under the jaw on one side. Other signs and symptoms include:  Swelling of the cheek or under the tongue when eating.  Pain in the swollen area.  Trouble chewing or swallowing.  Swelling that goes down after eating.  How is this diagnosed? Your health care provider may diagnose a salivary gland stone based on your signs and symptoms. The health care provider will also do a physical exam. In many cases, a stone can be felt in a duct inside your mouth. You may need to see an ear,  nose, and throat specialist (ENT or otolaryngologist) for diagnosis and treatment. You may also need to have diagnostic tests. These may include imaging studies to check for a stone, such as:  X-rays.  Ultrasound.  CT scan.  MRI.  How is this treated? Home care may be enough to treat a small stone that is not causing symptoms. Treatment of a stone that is large enough to cause symptoms may include:  Probing and widening the duct to allow the stone to pass.  Inserting a thin, flexible scope (endoscope) into the duct to locate and remove the stone.  Breaking up the stone with sound waves.  Removing the entire salivary gland.  Follow these instructions at home:  Drink enough fluid to keep your urine clear or pale yellow.  Follow these instructions every few hours: ? Suck on a lemon candy to stimulate the flow of saliva. ? Put a hot compress over the gland. ? Gently massage the gland.  Do not use any tobacco products, including cigarettes, chewing tobacco, or electronic cigarettes. If you need help quitting, ask your health care provider. Contact a health care provider if:  You have pain and swelling in your face, jaw, or mouth after eating.  You have persistent swelling in any of these places: ? In front of your ear. ? Under your jaw. ? Inside your mouth. Get help right away if:  You have pain and swelling in your face,   jaw, or mouth that are getting worse.  Your pain and swelling make it hard to swallow or breathe. This information is not intended to replace advice given to you by your health care provider. Make sure you discuss any questions you have with your health care provider. Document Released: 03/27/2004 Document Revised: 07/26/2015 Document Reviewed: 07/20/2013 Elsevier Interactive Patient Education  2018 [employer]  

## 2017-10-30 NOTE — Progress Notes (Signed)
Debra Fox is a 27 y.o. female with the following history as recorded in EpicCare:  Patient Active Problem List   Diagnosis Date Noted  . Encounter for general adult medical examination with abnormal findings 07/16/2016  . Abdominal bloating 07/16/2016  . Routine general medical examination at a health care facility 11/21/2014  . Fatigue 09/18/2014  . Complete tear of right ACL 05/19/2014  . Bucket-handle tear of medial meniscus of right knee as current injury 05/19/2014    Current Outpatient Medications  Medication Sig Dispense Refill  . busPIRone (BUSPAR) 7.5 MG tablet Take 2 tablets (15 mg total) by mouth 2 (two) times daily as needed. 60 tablet 1   No current facility-administered medications for this visit.     Allergies: Patient has no known allergies.  Past Medical History:  Diagnosis Date  . ACL tear 05/2014   right  . Bucket-handle tear of medial meniscus of right knee as current injury 05/19/2014  . Complete tear of right ACL 05/19/2014  . Derangement of posterior horn of medial meniscus of right knee 05/2014    Past Surgical History:  Procedure Laterality Date  . ARTHROSCOPY WITH ANTERIOR CRUCIATE LIGAMENT (ACL) REPAIR WITH ANTERIOR TIBILIAS GRAFT Right 05/19/2014   Procedure: RIGHT KNEE ARTHROSCOPY MEDIAL MENISCECTOMY WITH ANTERIOR CRUCIATE LIGAMENT (ACL) TIBIAL ANTERIOR ALLOGRAFT;  Surgeon: Marchia Bond, MD;  Location: [facility];  Service: Orthopedics;  Laterality: Right;  . MENISCUS REPAIR  05/19/2014   Procedure: REPAIR OF MENISCUS;  Surgeon: Marchia Bond, MD;  Location: [place] [facility];  Service: Orthopedics;;  . NO PAST SURGERIES      Family History  Problem Relation Age of Onset  . Healthy Mother   . Heart attack Maternal Grandmother   . Cancer Maternal Grandfather   . Healthy Paternal Grandmother   . Healthy Paternal Grandfather     Social History   Tobacco Use  . Smoking status: Never Smoker  . Smokeless tobacco: Never Used   Substance Use Topics  . Alcohol use: Yes    Comment: occasionally    Subjective:  Sore throat x 2 days; no fever; worried about symptoms going into upcoming holiday weekend; + painful to swallow; + white spot on right side of tonsil; feels that throat has been "swollen" recently;    Objective:  Vitals:   10/30/17 1401  BP: 108/70  Pulse: 86  Temp: 98.2 F (36.8 C)  TempSrc: Oral  SpO2: 98%  Weight: 174 lb (78.9 kg)  Height: _0  (1.778 m)    General: Well developed, well nourished, in no acute distress  Skin : Warm and dry.  Head: Normocephalic and atraumatic  Eyes: Sclera and conjunctiva clear; pupils round and reactive to light; extraocular movements intact  Ears: External normal; canals clear; tympanic membranes normal  Oropharynx: Pink, supple. No suspicious lesions  Neck: Supple with questionable thyromegaly, adenopathy  Lungs: Respirations unlabored; clear to auscultation bilaterally without wheeze, rales, rhonchi  CVS exam: normal rate and regular rhythm.  Neurologic: Alert and oriented; speech intact; face symmetrical; moves all extremities well; CNII-XII intact without focal deficit   Assessment:  1. Thyroid enlarged     Plan:  Update labs and thyroid ultrasound; follow-up to be determined.   No follow-ups on file.  Orders Placed This Encounter  Procedures  . US THYROID    Standing Status:   Future    Standing Expiration Date:   12/31/2018    Order Specific Question:   Reason for Exam (SYMPTOM  OR DIAGNOSIS REQUIRED)    Answer:   thyroid ultrasound    Order Specific Question:   Preferred imaging location?    Answer:   [facility]  . CBC w/Diff    Standing Status:   Future    Number of Occurrences:   1    Standing Expiration Date:   10/30/2018  . Comp Met (CMET)    Standing Status:   Future    Number of Occurrences:   1    Standing Expiration Date:   10/30/2018  . TSH    Standing Status:   Future    Number of Occurrences:   1    Standing  Expiration Date:   10/30/2018    Requested Prescriptions    No prescriptions requested or ordered in this encounter

## 2017-11-06 ENCOUNTER — Ambulatory Visit
Admission: RE | Admit: 2017-11-06 | Discharge: 2017-11-06 | Disposition: A | Discharge status: Home / Self Care | Payer: BLUE CROSS/BLUE SHIELD | Source: Ambulatory Visit | Attending: Family | Admitting: Family

## 2017-11-06 DIAGNOSIS — E049 Nontoxic goiter, unspecified: Secondary | ICD-10-CM

## 2017-11-26 ENCOUNTER — Encounter: Payer: Self-pay | Admitting: Family

## 2018-03-22 ENCOUNTER — Other Ambulatory Visit: Payer: Self-pay | Admitting: Family

## 2018-03-22 MED ORDER — BUSPIRONE HCL 7.5 MG PO TABS: 15.0000 mg | ORAL_TABLET | Freq: Two times a day (BID) | ORAL | 1 refills | Status: AC | PRN

## 2018-03-22 NOTE — Telephone Encounter (Signed)
Requested medication (s) are due for refill today: yes  Requested medication (s) are on the active medication list: yes  Last refill:  07/17/17  Future visit scheduled: no  Notes to clinic:  Medication not delegated to NT for refill   Requested Prescriptions  Pending Prescriptions Disp Refills   busPIRone (BUSPAR) 7.5 MG tablet 60 tablet 1    Sig: Take 2 tablets (15 mg total) by mouth 2 (two) times daily as needed.     Not Delegated - Psychiatry:  Anxiolytics/Hypnotics Failed - 03/22/2018  2:56 PM      Failed - This refill cannot be delegated      Failed - Urine Drug Screen completed in last 360 days.      Passed - Valid encounter within last 6 months    Recent Outpatient Visits          4 months ago Thyroid enlarged   [facility] -Shanna Cisco, Allyne Gee, FNP   6 months ago Cervical cancer screening   [facility] -Shanna Cisco, Allyne Gee, FNP   8 months ago Situational anxiety   [facility] -Shanna Cisco, Allyne Gee, FNP   11 months ago Situational anxiety   [facility] -Shanna Cisco, Allyne Gee, FNP   1 year ago Acute cystitis without hematuria   [facility] -Shanna Cisco, Allyne Gee, FNP

## 2018-03-22 NOTE — Telephone Encounter (Signed)
Copied from CRM [id number]. Topic: Quick Communication - Rx Refill/Question >> Mar 22, 2018  2:47 PM Jaquita Rectoravis, Karen A wrote: Medication: busPIRone (BUSPAR) 7.5 MG tablet   Has the patient contacted their pharmacy? Yes.   (Agent: If no, request that the patient contact the pharmacy for the refill.) (Agent: If yes, when and what did the pharmacy advise?)  Preferred Pharmacy (with phone number or street name): [id number] - [place], [place] - [address] [phone] (Phone) [phone] (Fax)    Agent: Please be advised that RX refills may take up to 3 business days. We ask that you follow-up with your pharmacy.

## 2018-04-12 ENCOUNTER — Other Ambulatory Visit: Payer: Self-pay | Admitting: Family

## 2018-04-12 ENCOUNTER — Telehealth: Payer: Self-pay

## 2018-04-12 DIAGNOSIS — K5900 Constipation, unspecified: Secondary | ICD-10-CM

## 2018-04-12 NOTE — Telephone Encounter (Signed)
Copied from CRM [id number]. Topic: Referral - Request for Referral >> Apr 12, 2018 12:00 PM Marylen Ponto wrote: Has patient seen PCP for this complaint? yes  *If NO, is insurance requiring patient see PCP for this issue before PCP can refer them? Referral for which specialty: GI Preferred provider/office: Patient stated she has changed jobs and would like a referral to a GI doctor in [place]  Reason for referral: GI

## 2018-08-03 ENCOUNTER — Encounter: Payer: Self-pay | Admitting: Internal Medicine

## 2018-08-03 ENCOUNTER — Ambulatory Visit (INDEPENDENT_AMBULATORY_CARE_PROVIDER_SITE_OTHER): Payer: Managed Care, Other (non HMO) | Admitting: Internal Medicine

## 2018-08-03 ENCOUNTER — Other Ambulatory Visit: Payer: Self-pay

## 2018-08-03 VITALS — BP 118/64 | HR 61 | Temp 98.8°F | Resp 16 | Ht 70.0 in | Wt 188.8 lb

## 2018-08-03 DIAGNOSIS — H9201 Otalgia, right ear: Secondary | ICD-10-CM | POA: Diagnosis not present

## 2018-08-03 DIAGNOSIS — H9191 Unspecified hearing loss, right ear: Secondary | ICD-10-CM | POA: Diagnosis not present

## 2018-08-03 NOTE — Assessment & Plan Note (Signed)
Decreased hearing in right ear and discomfort in the ear secondary to excessive cerumen that is covering the tympanic membrane and mild abrasions in the ear canal secondary to itching with a Q-tip She did get some relief of her discomfort and improvement in her hearing after the CMA lavaged her right ear No evidence of infection Augmentin can be discontinued Avoid Q-tips-discussed ways that she can irrigate her ears at home if needed 

## 2018-08-03 NOTE — Progress Notes (Signed)
Patient consent obtained. Irrigation with water and peroxide performed. Full view of tympanic membranes after procedure.  Patient tolerated procedure well.   

## 2018-08-03 NOTE — Patient Instructions (Addendum)
Your ear discomfort is related to ear wax in the ear canal and mild injury from the q-tip.  Your ear was cleaned out today.    You can stop the antibiotic.

## 2018-08-03 NOTE — Assessment & Plan Note (Signed)
Decreased hearing in right ear and discomfort in the ear secondary to excessive cerumen that is covering the tympanic membrane and mild abrasions in the ear canal secondary to itching with a Q-tip She did get some relief of her discomfort and improvement in her hearing after the CMA lavaged her right ear No evidence of infection Augmentin can be discontinued Avoid Q-tips-discussed ways that she can irrigate her ears at home if needed

## 2018-08-03 NOTE — Progress Notes (Signed)
Subjective:    Patient ID: Debra Fox, female    DOB: [birth year], 28 y.o.   MRN: [id number]  HPI She is here for an acute visit for cold symptoms.  Her symptoms started just over a week ago.    She is experiencing right ear felt itching on the inside. She stuck a q-tip in her ear and she got a lot of ear wax out.  She had difficulty hearing after that.  She tried irrigating the ear with water and baking soda, but there is no improvement.  She had discomfort in the ear and decreased hearing.  One week ago she did a teledoc visit and she was prescribed augmentin.    She has taken augmentin w/o improvement.    Medications and allergies reviewed with patient and updated if appropriate.  Patient Active Problem List   Diagnosis Date Noted  . Encounter for general adult medical examination with abnormal findings 07/16/2016  . Abdominal bloating 07/16/2016  . Routine general medical examination at a health care facility 11/21/2014  . Fatigue 09/18/2014  . Complete tear of right ACL 05/19/2014  . Bucket-handle tear of medial meniscus of right knee as current injury 05/19/2014    Current Outpatient Medications on File Prior to Visit  Medication Sig Dispense Refill  . busPIRone (BUSPAR) 7.5 MG tablet Take 2 tablets (15 mg total) by mouth 2 (two) times daily as needed. 60 tablet 1  . escitalopram (LEXAPRO) 10 MG tablet Take 10 mg by mouth daily.     No current facility-administered medications on file prior to visit.     Past Medical History:  Diagnosis Date  . ACL tear 05/2014   right  . Bucket-handle tear of medial meniscus of right knee as current injury 05/19/2014  . Complete tear of right ACL 05/19/2014  . Derangement of posterior horn of medial meniscus of right knee 05/2014    Past Surgical History:  Procedure Laterality Date  . ARTHROSCOPY WITH ANTERIOR CRUCIATE LIGAMENT (ACL) REPAIR WITH ANTERIOR TIBILIAS GRAFT Right 05/19/2014   Procedure: RIGHT KNEE ARTHROSCOPY MEDIAL  MENISCECTOMY WITH ANTERIOR CRUCIATE LIGAMENT (ACL) TIBIAL ANTERIOR ALLOGRAFT;  Surgeon: Teryl Lucy, MD;  Location: [facility];  Service: Orthopedics;  Laterality: Right;  . MENISCUS REPAIR  05/19/2014   Procedure: REPAIR OF MENISCUS;  Surgeon: Teryl Lucy, MD;  Location: [facility];  Service: Orthopedics;;  . NO PAST SURGERIES      Social History   Socioeconomic History  . Marital status: Married    Spouse name: Not on file  . Number of children: 2  . Years of education: 52  . Highest education level: Not on file  Occupational History  . Occupation: [employer]  Social Needs  . Financial resource strain: Not on file  . Food insecurity:    Worry: Not on file    Inability: Not on file  . Transportation needs:    Medical: Not on file    Non-medical: Not on file  Tobacco Use  . Smoking status: Never Smoker  . Smokeless tobacco: Never Used  Substance and Sexual Activity  . Alcohol use: Yes    Comment: occasionally  . Drug use: No  . Sexual activity: Yes    Birth control/protection: None  Lifestyle  . Physical activity:    Days per week: Not on file    Minutes per session: Not on file  . Stress: Not on file  Relationships  . Social connections:  Talks on phone: Not on file    Gets together: Not on file    Attends religious service: Not on file    Active member of club or organization: Not on file    Attends meetings of clubs or organizations: Not on file    Relationship status: Not on file  Other Topics Concern  . Not on file  Social History Narrative   Fun: Sleep   Denies religious beliefs effecting health care.   Denies abuse and feels safe at home.     Family History  Problem Relation Age of Onset  . Healthy Mother   . Heart attack Maternal Grandmother   . Cancer Maternal Grandfather   . Healthy Paternal Grandmother   . Healthy Paternal Grandfather     Review of Systems  Constitutional: Negative for chills and  fever.  HENT: Positive for ear pain (dull, mild right ear pain) and hearing loss. Negative for congestion, sinus pain and sore throat.   Respiratory: Negative for cough, shortness of breath and wheezing.   Neurological: Positive for headaches (occ, no increased headaches). Negative for dizziness.       Objective:   Vitals:   08/03/18 1509  BP: 118/64  Pulse: 61  Resp: 16  Temp: 98.8 F (37.1 C)  SpO2: 97%   Filed Weights   08/03/18 1509  Weight: 188 lb 12.8 oz (85.6 kg)   Body mass index is 27.09 kg/m.  Wt Readings from Last 3 Encounters:  08/03/18 188 lb 12.8 oz (85.6 kg)  10/30/17 174 lb (78.9 kg)  09/16/17 177 lb (80.3 kg)     Physical Exam GENERAL APPEARANCE: Appears stated age, well appearing, NAD EYES: conjunctiva clear, no icterus HEENT: Right ear canal with obstructed cerumen deep in the canal, TM unable to be visualized, a couple of superficial abrasions in the lateral ear canal likely from the Q-tip with dried blood; left ear canal with small amount of cerumen, TM visualized and normal-appearing, oropharynx with no erythema, no thyromegaly, trachea midline, no cervical or supraclavicular lymphadenopathy LUNGS: Clear to auscultation without wheeze or crackles, unlabored breathing, good air entry bilaterally CARDIOVASCULAR: Normal S1,S2 without murmurs, no edema SKIN: warm, dry  She did verbally consent to a right ear lavage.  The CMA lavaged her right ear successfully and remove the cerumen.  She did experience relief of the pain and was able to hear following the procedure.  No evidence of infection after cerumen removed.      Assessment & Plan:   See Problem List for Assessment and Plan of chronic medical problems.

## 2018-09-23 MED ORDER — KETOROLAC TROMETHAMINE 15 MG/ML IJ SOLN
15.00 | INTRAMUSCULAR | Status: DC
Start: 2018-09-22 — End: 2018-09-23

## 2018-09-23 MED ORDER — ACETAMINOPHEN 650 MG RE SUPP
650.00 | RECTAL | Status: DC
Start: ? — End: 2018-09-23

## 2018-09-23 MED ORDER — GENERIC EXTERNAL MEDICATION
Status: DC
Start: ? — End: 2018-09-23

## 2018-09-23 MED ORDER — MORPHINE SULFATE (PF) 4 MG/ML IV SOLN
4.00 | INTRAVENOUS | Status: DC
Start: ? — End: 2018-09-23

## 2018-09-23 MED ORDER — ALBUTEROL SULFATE (2.5 MG/3ML) 0.083% IN NEBU
2.50 | INHALATION_SOLUTION | RESPIRATORY_TRACT | Status: DC
Start: ? — End: 2018-09-23

## 2018-09-23 MED ORDER — ACETAMINOPHEN 325 MG PO TABS
650.00 | ORAL_TABLET | ORAL | Status: DC
Start: ? — End: 2018-09-23

## 2018-09-23 MED ORDER — MORPHINE SULFATE (PF) 4 MG/ML IV SOLN
2.00 | INTRAVENOUS | Status: DC
Start: ? — End: 2018-09-23

## 2018-09-23 MED ORDER — PROMETHAZINE HCL 25 MG PO TABS
25.00 | ORAL_TABLET | ORAL | Status: DC
Start: ? — End: 2018-09-23

## 2018-09-23 MED ORDER — GENERIC EXTERNAL MEDICATION
2.00 | Status: DC
Start: 2018-09-23 — End: 2018-09-23

## 2018-09-23 MED ORDER — ENOXAPARIN SODIUM 40 MG/0.4ML ~~LOC~~ SOLN
40.00 | SUBCUTANEOUS | Status: DC
Start: 2018-09-23 — End: 2018-09-23

## 2018-09-23 MED ORDER — NITROGLYCERIN 0.4 MG SL SUBL
0.40 | SUBLINGUAL_TABLET | SUBLINGUAL | Status: DC
Start: ? — End: 2018-09-23

## 2018-09-23 MED ORDER — ESCITALOPRAM OXALATE 10 MG PO TABS
10.00 | ORAL_TABLET | ORAL | Status: DC
Start: 2018-09-23 — End: 2018-09-23

## 2018-09-23 MED ORDER — PROMETHAZINE HCL 25 MG/ML IJ SOLN
12.50 | INTRAMUSCULAR | Status: DC
Start: ? — End: 2018-09-23

## 2018-09-23 MED ORDER — HYDROCODONE-ACETAMINOPHEN 10-325 MG PO TABS
1.00 | ORAL_TABLET | ORAL | Status: DC
Start: ? — End: 2018-09-23

## 2018-09-23 MED ORDER — BUSPIRONE HCL 5 MG PO TABS
15.00 | ORAL_TABLET | ORAL | Status: DC
Start: 2018-09-23 — End: 2018-09-23

## 2018-09-23 MED ORDER — HYDROCODONE-ACETAMINOPHEN 5-325 MG PO TABS
1.00 | ORAL_TABLET | ORAL | Status: DC
Start: ? — End: 2018-09-23

## 2018-09-23 MED ORDER — SENNA 8.6 MG PO TABS
2.00 | ORAL_TABLET | ORAL | Status: DC
Start: ? — End: 2018-09-23

## 2018-09-23 MED ORDER — POLYETHYLENE GLYCOL 3350 17 G PO PACK
17.00 | PACK | ORAL | Status: DC
Start: ? — End: 2018-09-23

## 2018-10-01 ENCOUNTER — Ambulatory Visit (INDEPENDENT_AMBULATORY_CARE_PROVIDER_SITE_OTHER): Payer: Managed Care, Other (non HMO) | Admitting: Family

## 2018-10-01 ENCOUNTER — Other Ambulatory Visit: Payer: Self-pay

## 2018-10-01 ENCOUNTER — Other Ambulatory Visit (INDEPENDENT_AMBULATORY_CARE_PROVIDER_SITE_OTHER): Payer: Managed Care, Other (non HMO)

## 2018-10-01 ENCOUNTER — Encounter: Payer: Self-pay | Admitting: Family

## 2018-10-01 VITALS — BP 102/78 | HR 76 | Temp 98.2°F | Ht 70.0 in | Wt 175.0 lb

## 2018-10-01 DIAGNOSIS — N12 Tubulo-interstitial nephritis, not specified as acute or chronic: Secondary | ICD-10-CM

## 2018-10-01 LAB — URINALYSIS, ROUTINE W REFLEX MICROSCOPIC
Bilirubin Urine: NEGATIVE
Ketones, ur: NEGATIVE
Leukocytes,Ua: NEGATIVE
Nitrite: NEGATIVE
Specific Gravity, Urine: 1.01 (ref 1.000–1.030)
Total Protein, Urine: NEGATIVE
Urine Glucose: NEGATIVE
Urobilinogen, UA: 0.2 (ref 0.0–1.0)
pH: 7.5 (ref 5.0–8.0)

## 2018-10-01 LAB — COMPREHENSIVE METABOLIC PANEL
ALT: 28 U/L (ref 0–35)
AST: 15 U/L (ref 0–37)
Albumin: 4.8 g/dL (ref 3.5–5.2)
Alkaline Phosphatase: 42 U/L (ref 39–117)
BUN: 9 mg/dL (ref 6–23)
CO2: 29 mEq/L (ref 19–32)
Calcium: 9.8 mg/dL (ref 8.4–10.5)
Chloride: 101 mEq/L (ref 96–112)
Creatinine, Ser: 0.89 mg/dL (ref 0.40–1.20)
GFR: 74.7 mL/min (ref >60.00)
Glucose, Bld: 86 mg/dL (ref 70–99)
Potassium: 4.1 mEq/L (ref 3.5–5.1)
Sodium: 138 mEq/L (ref 135–145)
Total Bilirubin: 0.7 mg/dL (ref 0.2–1.2)
Total Protein: 8 g/dL (ref 6.0–8.3)

## 2018-10-01 LAB — CBC WITH DIFFERENTIAL/PLATELET
Basophils Absolute: 0 10*3/uL (ref 0.0–0.1)
Basophils Relative: 0.4 % (ref 0.0–3.0)
Eosinophils Absolute: 0 10*3/uL (ref 0.0–0.7)
Eosinophils Relative: 0.8 % (ref 0.0–5.0)
HCT: 41 % (ref 36.0–46.0)
Hemoglobin: 13.8 g/dL (ref 12.0–15.0)
Lymphocytes Relative: 35.7 % (ref 12.0–46.0)
Lymphs Abs: 1.7 10*3/uL (ref 0.7–4.0)
MCHC: 33.7 g/dL (ref 30.0–36.0)
MCV: 95.3 fl (ref 78.0–100.0)
Monocytes Absolute: 0.4 10*3/uL (ref 0.1–1.0)
Monocytes Relative: 8.6 % (ref 3.0–12.0)
Neutro Abs: 2.5 10*3/uL (ref 1.4–7.7)
Neutrophils Relative %: 54.5 % (ref 43.0–77.0)
Platelets: 353 10*3/uL (ref 150.0–400.0)
RBC: 4.3 Mil/uL (ref 3.87–5.11)
RDW: 12.8 % (ref 11.5–15.5)
WBC: 4.7 10*3/uL (ref 4.0–10.5)

## 2018-10-01 NOTE — Progress Notes (Signed)
Debra Fox is a 28 y.o. female with the following history as recorded in EpicCare:  Patient Active Problem List   Diagnosis Date Noted  . Right ear pain 08/03/2018  . Decreased hearing of right ear 08/03/2018  . Encounter for general adult medical examination with abnormal findings 07/16/2016  . Abdominal bloating 07/16/2016  . Routine general medical examination at a health care facility 11/21/2014  . Fatigue 09/18/2014  . Complete tear of right ACL 05/19/2014  . Bucket-handle tear of medial meniscus of right knee as current injury 05/19/2014    Current Outpatient Medications  Medication Sig Dispense Refill  . busPIRone (BUSPAR) 7.5 MG tablet Take 2 tablets (15 mg total) by mouth 2 (two) times daily as needed. 60 tablet 1  . cefdinir (OMNICEF) 300 MG capsule TAKE ONE CAPSULE (300 MG DOSE) BY MOUTH 2 (TWO) TIMES DAILY FOR 10 DAYS.    Marland Kitchen escitalopram (LEXAPRO) 10 MG tablet Take 10 mg by mouth daily.    . phentermine (ADIPEX-P) 37.5 MG tablet TAKE 1/2 TABLET IN THE AM X 5DAYS AND INCREASE TO ONE TABLET AFTERWARD IF TOLERATED     No current facility-administered medications for this visit.     Allergies: Patient has no known allergies.  Past Medical History:  Diagnosis Date  . ACL tear 05/2014   right  . Bucket-handle tear of medial meniscus of right knee as current injury 05/19/2014  . Complete tear of right ACL 05/19/2014  . Derangement of posterior horn of medial meniscus of right knee 05/2014    Past Surgical History:  Procedure Laterality Date  . ARTHROSCOPY WITH ANTERIOR CRUCIATE LIGAMENT (ACL) REPAIR WITH ANTERIOR TIBILIAS GRAFT Right 05/19/2014   Procedure: RIGHT KNEE ARTHROSCOPY MEDIAL MENISCECTOMY WITH ANTERIOR CRUCIATE LIGAMENT (ACL) TIBIAL ANTERIOR ALLOGRAFT;  Surgeon: Marchia Bond, MD;  Location: [facility];  Service: Orthopedics;  Laterality: Right;  . MENISCUS REPAIR  05/19/2014   Procedure: REPAIR OF MENISCUS;  Surgeon: Marchia Bond, MD;  Location: [facility];  Service: Orthopedics;;  . NO PAST SURGERIES      Family History  Problem Relation Age of Onset  . Healthy Mother   . Heart attack Maternal Grandmother   . Cancer Maternal Grandfather   . Healthy Paternal Grandmother   . Healthy Paternal Grandfather     Social History   Tobacco Use  . Smoking status: Never Smoker  . Smokeless tobacco: Never Used  Substance Use Topics  . Alcohol use: Yes    Comment: occasionally    Subjective:  Was treated for UTI by teledoc provider; notes that 2 weeks after finishing the antibiotic symptoms seemed to recur and was re-treated again by teledoc provider; notes that symptoms were continuing to worsen and ended up going to the ER- was admitted to [facility] x 3 days with dehydration/ pyeloneophritis; Notes that today she is feeling much better- no fever, no burning with urination;   LMP- now   Objective:  Vitals:   10/01/18 1309  BP: 102/78  Pulse: 76  Temp: 98.2 F (36.8 C)  TempSrc: Oral  SpO2: 96%  Weight: 175 lb 0.6 oz (79.4 kg)  Height: 5' 10"  (1.778 m)    General: Well developed, well nourished, in no acute distress  Skin : Warm and dry.  Head: Normocephalic and atraumatic  Lungs: Respirations unlabored; clear to auscultation bilaterally without wheeze, rales, rhonchi  CVS exam: normal rate and regular rhythm.  Abdomen: Soft; nontender; nondistended; normoactive bowel sounds; no masses or hepatosplenomegaly  Neurologic: Alert and oriented; speech intact; face symmetrical; moves all extremities well; CNII-XII intact without focal deficit   Assessment:  1. Pyelonephritis     Plan:  Clinically doing well; no further symptoms; check CBC, CMP, U/A and urine culture today; follow-up as needed;   No follow-ups on file.  Orders Placed This Encounter  Procedures  . Urine Culture    Standing Status:   Future    Standing Expiration Date:   10/01/2019  . Urinalysis    Standing Status:   Future    Standing Expiration  Date:   10/01/2019  . CBC w/Diff    Standing Status:   Future    Standing Expiration Date:   10/01/2019  . Comp Met (CMET)    Standing Status:   Future    Standing Expiration Date:   10/01/2019    Requested Prescriptions    No prescriptions requested or ordered in this encounter

## 2018-10-02 LAB — URINE CULTURE
MICRO NUMBER:: 725026
Result:: NO GROWTH
SPECIMEN QUALITY:: ADEQUATE

## 2018-12-17 ENCOUNTER — Encounter: Payer: Self-pay | Admitting: Family

## 2018-12-17 ENCOUNTER — Ambulatory Visit (INDEPENDENT_AMBULATORY_CARE_PROVIDER_SITE_OTHER): Payer: Managed Care, Other (non HMO) | Admitting: Family

## 2018-12-17 DIAGNOSIS — Z87440 Personal history of urinary (tract) infections: Secondary | ICD-10-CM | POA: Diagnosis not present

## 2018-12-17 MED ORDER — CIPROFLOXACIN HCL 500 MG PO TABS: 500.0000 mg | ORAL_TABLET | Freq: Two times a day (BID) | ORAL | 0 refills | Status: DC

## 2018-12-17 MED ORDER — PHENAZOPYRIDINE HCL 200 MG PO TABS: 200.0000 mg | ORAL_TABLET | Freq: Three times a day (TID) | ORAL | 0 refills | Status: DC | PRN

## 2018-12-17 NOTE — Progress Notes (Signed)
Sahian CHALISE PE is a 28 y.o. female with the following history as recorded in EpicCare:  Patient Active Problem List   Diagnosis Date Noted  . Right ear pain 08/03/2018  . Decreased hearing of right ear 08/03/2018  . Encounter for general adult medical examination with abnormal findings 07/16/2016  . Abdominal bloating 07/16/2016  . Routine general medical examination at a health care facility 11/21/2014  . Fatigue 09/18/2014  . Complete tear of right ACL 05/19/2014  . Bucket-handle tear of medial meniscus of right knee as current injury 05/19/2014    Current Outpatient Medications  Medication Sig Dispense Refill  . busPIRone (BUSPAR) 7.5 MG tablet Take 2 tablets (15 mg total) by mouth 2 (two) times daily as needed. 60 tablet 1  . cefdinir (OMNICEF) 300 MG capsule TAKE ONE CAPSULE (300 MG DOSE) BY MOUTH 2 (TWO) TIMES DAILY FOR 10 DAYS.    . ciprofloxacin (CIPRO) 500 MG tablet Take 1 tablet (500 mg total) by mouth 2 (two) times daily. 10 tablet 0  . escitalopram (LEXAPRO) 10 MG tablet Take 10 mg by mouth daily.    . phenazopyridine (PYRIDIUM) 200 MG tablet Take 1 tablet (200 mg total) by mouth 3 (three) times daily as needed for pain. 30 tablet 0  . phentermine (ADIPEX-P) 37.5 MG tablet TAKE 1/2 TABLET IN THE AM X 5DAYS AND INCREASE TO ONE TABLET AFTERWARD IF TOLERATED     No current facility-administered medications for this visit.     Allergies: Patient has no known allergies.  Past Medical History:  Diagnosis Date  . ACL tear 05/2014   right  . Bucket-handle tear of medial meniscus of right knee as current injury 05/19/2014  . Complete tear of right ACL 05/19/2014  . Derangement of posterior horn of medial meniscus of right knee 05/2014    Past Surgical History:  Procedure Laterality Date  . ARTHROSCOPY WITH ANTERIOR CRUCIATE LIGAMENT (ACL) REPAIR WITH ANTERIOR TIBILIAS GRAFT Right 05/19/2014   Procedure: RIGHT KNEE ARTHROSCOPY MEDIAL MENISCECTOMY WITH ANTERIOR CRUCIATE LIGAMENT (ACL)  TIBIAL ANTERIOR ALLOGRAFT;  Surgeon: Teryl Lucy, MD;  Location: [facility];  Service: Orthopedics;  Laterality: Right;  . MENISCUS REPAIR  05/19/2014   Procedure: REPAIR OF MENISCUS;  Surgeon: Teryl Lucy, MD;  [facility];  Service: Orthopedics;;  . NO PAST SURGERIES      Family History  Problem Relation Age of Onset  . Healthy Mother   . Heart attack Maternal Grandmother   . Cancer Maternal Grandfather   . Healthy Paternal Grandmother   . Healthy Paternal Grandfather     Social History   Tobacco Use  . Smoking status: Never Smoker  . Smokeless tobacco: Never Used  Substance Use Topics  . Alcohol use: Yes    Comment: occasionally    Subjective:    I connected with Ailin J Hollar on 12/17/18 at  1:00 PM EDT by a video enabled telemedicine application and verified that I am speaking with the correct person using two identifiers. Provider is in the office; patient is at her workplace. Patient and I are only 2 people on the video call.    I discussed the limitations of evaluation and management by telemedicine and the availability of in person appointments. The patient expressed understanding and agreed to proceed.  Concerned for another UTI; was actually hospitalized earlier this summer (July 2020) with pyelonephritis; sensation of incomplete emptying; + pressure; no fever, no back pain;    Objective:  There were  no vitals filed for this visit.  General: Well developed, well nourished, in no acute distress  Head: Normocephalic and atraumatic  Lungs: Respirations unlabored;  Neurologic: Alert and oriented; speech intact;   Assessment:  1. History of recurrent UTIs     Plan:  Rx for Cipro 500 mg bid x 5 days; refer to urology due to frequency/ severity of infections- patient is in agreement and specifically requested referral as well. She will return to the office after finishing her antibiotics to make sure that infection has cleared.    No follow-ups on file.  Orders Placed This Encounter  Procedures  . Urine Culture    Standing Status:   Future    Standing Expiration Date:   12/17/2019  . Ambulatory referral to Urology    Referral Priority:   Routine    Referral Type:   Consultation    Referral Reason:   Specialty Services Required    Requested Specialty:   Urology    Number of Visits Requested:   1    Requested Prescriptions   Signed Prescriptions Disp Refills  . ciprofloxacin (CIPRO) 500 MG tablet 10 tablet 0    Sig: Take 1 tablet (500 mg total) by mouth 2 (two) times daily.  . phenazopyridine (PYRIDIUM) 200 MG tablet 30 tablet 0    Sig: Take 1 tablet (200 mg total) by mouth 3 (three) times daily as needed for pain.

## 2018-12-22 ENCOUNTER — Other Ambulatory Visit: Payer: Managed Care, Other (non HMO)

## 2018-12-22 DIAGNOSIS — Z87440 Personal history of urinary (tract) infections: Secondary | ICD-10-CM

## 2018-12-24 LAB — URINE CULTURE
MICRO NUMBER:: 1014107
Result:: NO GROWTH
SPECIMEN QUALITY:: ADEQUATE

## 2019-01-11 ENCOUNTER — Other Ambulatory Visit: Payer: Self-pay

## 2019-01-11 DIAGNOSIS — Z20822 Contact with and (suspected) exposure to covid-19: Secondary | ICD-10-CM

## 2019-01-14 LAB — NOVEL CORONAVIRUS, NAA: SARS-CoV-2, NAA: NOT DETECTED

## 2019-10-03 ENCOUNTER — Ambulatory Visit (INDEPENDENT_AMBULATORY_CARE_PROVIDER_SITE_OTHER): Payer: 59 | Admitting: Psychology

## 2019-10-03 DIAGNOSIS — F4321 Adjustment disorder with depressed mood: Secondary | ICD-10-CM

## 2019-10-10 ENCOUNTER — Ambulatory Visit (INDEPENDENT_AMBULATORY_CARE_PROVIDER_SITE_OTHER): Payer: 59 | Admitting: Psychology

## 2019-10-10 DIAGNOSIS — F4321 Adjustment disorder with depressed mood: Secondary | ICD-10-CM

## 2019-10-17 ENCOUNTER — Ambulatory Visit (INDEPENDENT_AMBULATORY_CARE_PROVIDER_SITE_OTHER): Payer: 59 | Admitting: Psychology

## 2019-10-17 DIAGNOSIS — F4321 Adjustment disorder with depressed mood: Secondary | ICD-10-CM

## 2019-11-22 ENCOUNTER — Ambulatory Visit: Payer: Managed Care, Other (non HMO) | Admitting: Family

## 2019-11-23 ENCOUNTER — Telehealth: Payer: Self-pay | Admitting: Family

## 2019-11-23 NOTE — Telephone Encounter (Signed)
   Spouse calling to report patient is not taking medication properly. He states she is not abusing medication, but simply not taking as instructed. He wants to have a  conversation regarding her erratic behavior, prior to patient rescheduling her last missed appointment. Scheduler encouraged spouse to have patient call for appointment.

## 2019-11-29 ENCOUNTER — Ambulatory Visit (INDEPENDENT_AMBULATORY_CARE_PROVIDER_SITE_OTHER): Payer: 59 | Admitting: Psychology

## 2019-11-29 ENCOUNTER — Other Ambulatory Visit: Payer: Self-pay

## 2019-11-29 ENCOUNTER — Ambulatory Visit (INDEPENDENT_AMBULATORY_CARE_PROVIDER_SITE_OTHER): Payer: Managed Care, Other (non HMO) | Admitting: Family

## 2019-11-29 VITALS — BP 110/78 | HR 78 | Temp 98.2°F | Ht 70.0 in | Wt 171.0 lb

## 2019-11-29 DIAGNOSIS — F4321 Adjustment disorder with depressed mood: Secondary | ICD-10-CM

## 2019-11-29 DIAGNOSIS — N926 Irregular menstruation, unspecified: Secondary | ICD-10-CM

## 2019-11-29 DIAGNOSIS — F329 Major depressive disorder, single episode, unspecified: Secondary | ICD-10-CM | POA: Diagnosis not present

## 2019-11-29 DIAGNOSIS — F32A Depression, unspecified: Secondary | ICD-10-CM

## 2019-11-29 DIAGNOSIS — F419 Anxiety disorder, unspecified: Secondary | ICD-10-CM | POA: Diagnosis not present

## 2019-11-29 NOTE — Progress Notes (Signed)
Debra Fox is a 29 y.o. female with the following history as recorded in EpicCare:  Patient Active Problem List   Diagnosis Date Noted  . Right ear pain 08/03/2018  . Decreased hearing of right ear 08/03/2018  . Encounter for general adult medical examination with abnormal findings 07/16/2016  . Abdominal bloating 07/16/2016  . Routine general medical examination at a health care facility 11/21/2014  . Fatigue 09/18/2014  . Complete tear of right ACL 05/19/2014  . Bucket-handle tear of medial meniscus of right knee as current injury 05/19/2014    Current Outpatient Medications  Medication Sig Dispense Refill  . busPIRone (BUSPAR) 7.5 MG tablet Take 2 tablets (15 mg total) by mouth 2 (two) times daily as needed. 60 tablet 1  . escitalopram (LEXAPRO) 10 MG tablet Take 10 mg by mouth daily.    . phentermine (ADIPEX-P) 37.5 MG tablet TAKE 1/2 TABLET IN THE AM X 5DAYS AND INCREASE TO ONE TABLET AFTERWARD IF TOLERATED     No current facility-administered medications for this visit.    Allergies: Patient has no known allergies.  Past Medical History:  Diagnosis Date  . ACL tear 05/2014   right  . Bucket-handle tear of medial meniscus of right knee as current injury 05/19/2014  . Complete tear of right ACL 05/19/2014  . Derangement of posterior horn of medial meniscus of right knee 05/2014    Past Surgical History:  Procedure Laterality Date  . ARTHROSCOPY WITH ANTERIOR CRUCIATE LIGAMENT (ACL) REPAIR WITH ANTERIOR TIBILIAS GRAFT Right 05/19/2014   Procedure: RIGHT KNEE ARTHROSCOPY MEDIAL MENISCECTOMY WITH ANTERIOR CRUCIATE LIGAMENT (ACL) TIBIAL ANTERIOR ALLOGRAFT;  Surgeon: Teryl Lucy, MD;  Location: [facility];  Service: Orthopedics;  Laterality: Right;  . MENISCUS REPAIR  05/19/2014   Procedure: REPAIR OF MENISCUS;  Surgeon: Teryl Lucy, MD;  [facility];  Service: Orthopedics;;  . NO PAST SURGERIES      Family History  Problem Relation Age of  Onset  . Healthy Mother   . Heart attack Maternal Grandmother   . Cancer Maternal Grandfather   . Healthy Paternal Grandmother   . Healthy Paternal Grandfather     Social History   Tobacco Use  . Smoking status: Never Smoker  . Smokeless tobacco: Never Used  Substance Use Topics  . Alcohol use: Yes    Comment: occasionally    Subjective:  Patient had originally scheduled her appointment due to concerns for no period x 4 months; notes that period actually started 3 days ago but is "very light." Was 16 when first period started/ has had history of irregular periods but no difficulty with pregnancy;  Working with counselor/ psych NP- managing medications and therapy;  No further issues with UTI/ kidney infections in the past year;   Objective:  Vitals:   11/29/19 0843  BP: 110/78  Pulse: 78  Temp: 98.2 F (36.8 C)  TempSrc: Oral  SpO2: 99%  Weight: 171 lb (77.6 kg)  Height: 5\' 10"  (1.778 m)    General: Well developed, well nourished, in no acute distress  Head: Normocephalic and atraumatic  Eyes: Sclera and conjunctiva clear; pupils round and reactive to light; extraocular movements intact  Ears: External normal; canals clear; tympanic membranes normal  Oropharynx: Pink, supple. No suspicious lesions  Neck: Supple without thyromegaly, adenopathy  Lungs: Respirations unlabored; clear to auscultation bilaterally without wheeze, rales, rhonchi  CVS exam: normal rate and regular rhythm.  Neurologic: Alert and oriented; speech intact; face symmetrical; moves  all extremities well; CNII-XII intact without focal deficit   Assessment:  1. Irregular periods   2. Anxiety and depression     Plan:  1. Will refer to GYN; discussed updating labs today but will let GYN manage; may need to see endocrine as well; follow-up to be determined; 2. Stable on medications; working with counselor; has recently separated from husband but they are doing marriage counseling and she is hopeful;  follow-up as needed;  Time spent 30 minutes  No follow-ups on file.  Orders Placed This Encounter  Procedures  . Ambulatory referral to Gynecology    Referral Priority:   Routine    Referral Type:   Consultation    Referral Reason:   Specialty Services Required    Requested Specialty:   Gynecology    Number of Visits Requested:   1    Requested Prescriptions    No prescriptions requested or ordered in this encounter

## 2019-11-29 NOTE — Patient Instructions (Signed)
Try OTC Voltaren Gel for the finger;

## 2019-12-07 ENCOUNTER — Telehealth: Payer: Self-pay | Admitting: Family

## 2019-12-07 NOTE — Telephone Encounter (Signed)
    Patient requesting order for ultrasound. Patient states she is already established with OBGYN. She was under the impression an order for a ultrasound only had been placed.  Please advise

## 2019-12-12 ENCOUNTER — Ambulatory Visit (INDEPENDENT_AMBULATORY_CARE_PROVIDER_SITE_OTHER): Payer: 59 | Admitting: Psychology

## 2019-12-12 DIAGNOSIS — F4321 Adjustment disorder with depressed mood: Secondary | ICD-10-CM

## 2019-12-12 NOTE — Telephone Encounter (Signed)
Patient requesting order for ultrasound. Patient states she is already established with OBGYN. She was under the impression an order for a ultrasound only had been places.

## 2019-12-12 NOTE — Telephone Encounter (Signed)
I am sorry for the confusion- I will let the GYN decide what treatment is most appropriate for her regarding the irregular periods. I have not ordered and did not plan to order an ultrasound.

## 2019-12-13 NOTE — Telephone Encounter (Signed)
Called pt there was no answer LMOM w/Laura response../lmb °

## 2020-04-15 ENCOUNTER — Encounter: Payer: Self-pay | Admitting: Family

## 2020-04-16 ENCOUNTER — Other Ambulatory Visit: Payer: Self-pay | Admitting: Family

## 2020-04-16 DIAGNOSIS — R300 Dysuria: Secondary | ICD-10-CM

## 2020-09-07 ENCOUNTER — Encounter: Payer: Self-pay | Admitting: Family Medicine

## 2020-09-07 ENCOUNTER — Other Ambulatory Visit: Payer: Self-pay

## 2020-09-07 ENCOUNTER — Ambulatory Visit (INDEPENDENT_AMBULATORY_CARE_PROVIDER_SITE_OTHER): Payer: Managed Care, Other (non HMO) | Admitting: Family Medicine

## 2020-09-07 ENCOUNTER — Other Ambulatory Visit (HOSPITAL_COMMUNITY)
Admission: RE | Admit: 2020-09-07 | Discharge: 2020-09-07 | Disposition: A | Discharge status: Home / Self Care | Payer: Managed Care, Other (non HMO) | Source: Ambulatory Visit | Attending: Family Medicine | Admitting: Family Medicine

## 2020-09-07 VITALS — BP 120/78 | HR 67 | Temp 97.9°F | Wt 183.0 lb

## 2020-09-07 DIAGNOSIS — Z975 Presence of (intrauterine) contraceptive device: Secondary | ICD-10-CM

## 2020-09-07 DIAGNOSIS — N898 Other specified noninflammatory disorders of vagina: Secondary | ICD-10-CM | POA: Insufficient documentation

## 2020-09-07 DIAGNOSIS — R300 Dysuria: Secondary | ICD-10-CM | POA: Diagnosis not present

## 2020-09-07 DIAGNOSIS — K5900 Constipation, unspecified: Secondary | ICD-10-CM

## 2020-09-07 LAB — POCT URINALYSIS DIPSTICK
Bilirubin, UA: NEGATIVE
Blood, UA: NEGATIVE
Glucose, UA: NEGATIVE
Ketones, UA: NEGATIVE
Leukocytes, UA: NEGATIVE
Nitrite, UA: NEGATIVE
Protein, UA: NEGATIVE
Spec Grav, UA: 1.015 (ref 1.010–1.025)
Urobilinogen, UA: NEGATIVE E.U./dL — AB
pH, UA: 6.5 (ref 5.0–8.0)

## 2020-09-07 NOTE — Progress Notes (Signed)
Subjective:    Patient ID: Debra Fox, female    DOB: [birth year], 30 y.o.   MRN: [id number]  Chief Complaint  Patient presents with   Urinary Tract Infection    Pain, burning and difficulty urinating.    HPI Patient was seen today for acute concern.  Pt with increased urinary urge with little urine produced and suprapubic pain x 1 day.  Denies nausea, vomiting, fever, chills, back pain, changes in soaps, lotions, or detergents patient endorses history of constipation.  BM 2 days ago.  Increase p.o. intake of water, tried cranberry pill and Pyridium.  Mild vaginal discharge doubt vaginal irritation.  IUD in place since last Sept.  Notes spotting last wk.  Past Medical History:  Diagnosis Date   ACL tear 05/2014   right   Bucket-handle tear of medial meniscus of right knee as current injury 05/19/2014   Complete tear of right ACL 05/19/2014   Derangement of posterior horn of medial meniscus of right knee 05/2014    No Known Allergies  ROS General: Denies fever, chills, night sweats, changes in weight, changes in appetite HEENT: Denies headaches, ear pain, changes in vision, rhinorrhea, sore throat CV: Denies CP, palpitations, SOB, orthopnea Pulm: Denies SOB, cough, wheezing GI: Denies abdominal pain, nausea, vomiting, diarrhea   +constipation GU: Denies dysuria, hematuria, frequency   +vaginal discharge, urgency Msk: Denies muscle cramps, joint pains Neuro: Denies weakness, numbness, tingling Skin: Denies rashes, bruising Psych: Denies depression, anxiety, hallucinations      Objective:    Blood pressure 120/78, pulse 67, temperature 97.9 F (36.6 C), weight 183 lb (83 kg), SpO2 97 %.  Gen. Pleasant, well-nourished, in no distress, normal affect   HEENT: [place]/AT, face symmetric, conjunctiva clear, no scleral icterus, PERRLA, EOMI, nares patent without drainage Lungs: no accessory muscle use Cardiovascular: RRR, no m/r/g, no peripheral edema GU: Aptima self swab obtained Abdomen:  BS present, soft, NT/ND.  No CVA tenderness Neuro:  A&Ox3, CN II-XII intact, normal gait Skin:  Warm, no lesions/ rash   Wt Readings from Last 3 Encounters:  09/07/20 183 lb (83 kg)  11/29/19 171 lb (77.6 kg)  10/01/18 175 lb 0.6 oz (79.4 kg)    Lab Results  Component Value Date   WBC 4.7 10/01/2018   HGB 13.8 10/01/2018   HCT 41.0 10/01/2018   PLT 353.0 10/01/2018   GLUCOSE 86 10/01/2018   CHOL 175 04/25/2015   TRIG 78.0 04/25/2015   HDL 46.00 04/25/2015   LDLCALC 113 (H) 04/25/2015   ALT 28 10/01/2018   AST 15 10/01/2018   NA 138 10/01/2018   K 4.1 10/01/2018   CL 101 10/01/2018   CREATININE 0.89 10/01/2018   BUN 9 10/01/2018   CO2 29 10/01/2018   TSH 1.27 10/30/2017    Assessment/Plan:  Dysuria -Discussed possible causes including UTI, vaginitis, constipation -UA normal  - Plan: POCT urinalysis dipstick  Vaginal discharge  - Plan: Cervicovaginal ancillary only  IUD (intrauterine device) in place -Likely contributing to discharge  Constipation, unspecified constipation type -Discussed regular bowel regimen including probiotic -Continue dietary modifications -Given handout  F/u as needed  Abbe Amsterdam, MD

## 2020-09-09 LAB — CERVICOVAGINAL ANCILLARY ONLY
Bacterial Vaginitis (gardnerella): NEGATIVE
Candida Glabrata: NEGATIVE
Candida Vaginitis: NEGATIVE
Comment: NEGATIVE
Comment: NEGATIVE
Comment: NEGATIVE
Comment: NEGATIVE
Trichomonas: NEGATIVE

## 2021-06-23 ENCOUNTER — Emergency Department (HOSPITAL_BASED_OUTPATIENT_CLINIC_OR_DEPARTMENT_OTHER): Payer: Managed Care, Other (non HMO)

## 2021-06-23 ENCOUNTER — Encounter (HOSPITAL_BASED_OUTPATIENT_CLINIC_OR_DEPARTMENT_OTHER): Payer: Self-pay

## 2021-06-23 ENCOUNTER — Emergency Department (HOSPITAL_BASED_OUTPATIENT_CLINIC_OR_DEPARTMENT_OTHER)
Admission: EM | Admit: 2021-06-23 | Discharge: 2021-06-23 | Disposition: A | Discharge status: Home / Self Care | Payer: Managed Care, Other (non HMO) | Attending: Emergency Medicine | Admitting: Emergency Medicine

## 2021-06-23 ENCOUNTER — Other Ambulatory Visit: Payer: Self-pay

## 2021-06-23 DIAGNOSIS — N9489 Other specified conditions associated with female genital organs and menstrual cycle: Secondary | ICD-10-CM | POA: Diagnosis not present

## 2021-06-23 DIAGNOSIS — J029 Acute pharyngitis, unspecified: Secondary | ICD-10-CM | POA: Diagnosis present

## 2021-06-23 DIAGNOSIS — J36 Peritonsillar abscess: Secondary | ICD-10-CM | POA: Diagnosis not present

## 2021-06-23 LAB — CBC WITH DIFFERENTIAL/PLATELET
Abs Immature Granulocytes: 0.02 10*3/uL (ref 0.00–0.07)
Basophils Absolute: 0 10*3/uL (ref 0.0–0.1)
Basophils Relative: 0 %
Eosinophils Absolute: 0.1 10*3/uL (ref 0.0–0.5)
Eosinophils Relative: 1 %
HCT: 38.6 % (ref 36.0–46.0)
Hemoglobin: 12.8 g/dL (ref 12.0–15.0)
Immature Granulocytes: 0 %
Lymphocytes Relative: 25 %
Lymphs Abs: 1.9 10*3/uL (ref 0.7–4.0)
MCH: 31.9 pg (ref 26.0–34.0)
MCHC: 33.2 g/dL (ref 30.0–36.0)
MCV: 96.3 fL (ref 80.0–100.0)
Monocytes Absolute: 0.6 10*3/uL (ref 0.1–1.0)
Monocytes Relative: 8 %
Neutro Abs: 5 10*3/uL (ref 1.7–7.7)
Neutrophils Relative %: 66 %
Platelets: 260 10*3/uL (ref 150–400)
RBC: 4.01 MIL/uL (ref 3.87–5.11)
RDW: 12 % (ref 11.5–15.5)
WBC: 7.5 10*3/uL (ref 4.0–10.5)
nRBC: 0 % (ref 0.0–0.2)

## 2021-06-23 LAB — COMPREHENSIVE METABOLIC PANEL
ALT: 12 U/L (ref 0–44)
AST: 13 U/L — ABNORMAL LOW (ref 15–41)
Albumin: 4.7 g/dL (ref 3.5–5.0)
Alkaline Phosphatase: 36 U/L — ABNORMAL LOW (ref 38–126)
Anion gap: 8 (ref 5–15)
BUN: 10 mg/dL (ref 6–20)
CO2: 29 mmol/L (ref 22–32)
Calcium: 9.6 mg/dL (ref 8.9–10.3)
Chloride: 101 mmol/L (ref 98–111)
Creatinine, Ser: 0.81 mg/dL (ref 0.44–1.00)
GFR, Estimated: >60 mL/min (ref >60)
Glucose, Bld: 88 mg/dL (ref 70–99)
Potassium: 3.6 mmol/L (ref 3.5–5.1)
Sodium: 138 mmol/L (ref 135–145)
Total Bilirubin: 0.6 mg/dL (ref 0.3–1.2)
Total Protein: 8.1 g/dL (ref 6.5–8.1)

## 2021-06-23 LAB — HCG, SERUM, QUALITATIVE: Preg, Serum: NEGATIVE

## 2021-06-23 MED ORDER — ONDANSETRON HCL 4 MG/2ML IJ SOLN
4.0000 mg | Freq: Once | INTRAMUSCULAR | Status: AC
  Administered 2021-06-23: 4 mg via INTRAVENOUS
  Filled 2021-06-23: qty 2

## 2021-06-23 MED ORDER — SODIUM CHLORIDE 0.9 % IV SOLN: 2.0000 g | Freq: Once | INTRAVENOUS | Status: DC

## 2021-06-23 MED ORDER — CLINDAMYCIN HCL 300 MG PO CAPS: 300.0000 mg | ORAL_CAPSULE | Freq: Three times a day (TID) | ORAL | 0 refills | Status: AC

## 2021-06-23 MED ORDER — CLINDAMYCIN HCL 150 MG PO CAPS
300.0000 mg | ORAL_CAPSULE | Freq: Three times a day (TID) | ORAL | 0 refills | Status: DC
Start: 2021-06-23 — End: 2021-06-23

## 2021-06-23 MED ORDER — SODIUM CHLORIDE 0.9 % IV SOLN
3.0000 g | Freq: Once | INTRAVENOUS | Status: AC
  Administered 2021-06-23: 3 g via INTRAVENOUS

## 2021-06-23 MED ORDER — SODIUM CHLORIDE 0.9 % IV BOLUS
1000.0000 mL | Freq: Once | INTRAVENOUS | Status: AC
  Administered 2021-06-23: 1000 mL via INTRAVENOUS

## 2021-06-23 MED ORDER — KETOROLAC TROMETHAMINE 15 MG/ML IJ SOLN
15.0000 mg | Freq: Once | INTRAMUSCULAR | Status: AC
  Administered 2021-06-23: 15 mg via INTRAVENOUS
  Filled 2021-06-23: qty 1

## 2021-06-23 MED ORDER — DEXAMETHASONE SODIUM PHOSPHATE 10 MG/ML IJ SOLN
10.0000 mg | Freq: Once | INTRAMUSCULAR | Status: AC
  Administered 2021-06-23: 10 mg
  Filled 2021-06-23: qty 1

## 2021-06-23 MED ORDER — IOHEXOL 300 MG/ML  SOLN
100.0000 mL | Freq: Once | INTRAMUSCULAR | Status: AC | PRN
  Administered 2021-06-23: 75 mL via INTRAVENOUS

## 2021-06-23 NOTE — ED Notes (Signed)
Pt discharged home after verbalizing understanding of discharge instructions; nad noted. 

## 2021-06-23 NOTE — ED Provider Notes (Signed)
?[facility]PT ?Provider Note ? ? ?CSN: [id number] ?Arrival date & time: 06/23/21  1104 ? ?  ? ?History ? ?Chief Complaint  ?Patient presents with  ? Sore Throat  ? ? ?Debra Fox is a 31 y.o. female. ? ? Patient as above with significant medical history as below, including right ACL injury, who presents to the ED with complaint of  ?sore throat. ? ?Symptom onset 1 week ago.  Progressively worsening.  Pain worse on the right side of her neck.  Difficulty swallowing secondary to discomfort.  Difficulty swallowing her own secretions secondary to discomfort.  Voice mildly muffled.  Was seen in urgent care earlier today, negative strep test.  Sent to the ER for evaluation. ? ?Patient denies history of recurrent tonsillitis, does not follow with ENT.  She is having some mild nausea that she believes is secondary to poor p.o. intake for last few days.  No change in urination or bowel function.  She is tolerating liquids but minimal.  Subjective fevers at home the last few days. ? ? ? ?Past Medical History: ?05/2014: ACL tear ?    Comment:  right ?05/19/2014: Bucket-handle tear of medial meniscus of right knee as  ?current injury ?05/19/2014: Complete tear of right ACL ?05/2014: Derangement of posterior horn of medial meniscus of right knee ? ?Past Surgical History: ?05/19/2014: ARTHROSCOPY WITH ANTERIOR CRUCIATE LIGAMENT (ACL) REPAIR  ?WITH ANTERIOR TIBILIAS GRAFT; Right ?    Comment:  Procedure: RIGHT KNEE ARTHROSCOPY MEDIAL MENISCECTOMY  ?             WITH ANTERIOR CRUCIATE LIGAMENT (ACL) TIBIAL ANTERIOR  ?             ALLOGRAFT;  Surgeon: Teryl Lucy, MD;  Location: [facility]  ?             [place];  Service: Orthopedics;  Laterality:  ?             Right; ?05/19/2014: MENISCUS REPAIR ?    Comment:  Procedure: REPAIR OF MENISCUS;  Surgeon: Teryl Lucy,  ?             MD;  Location: [facility];  Service:  ?             Orthopedics;; ?No date: NO PAST SURGERIES  ? ? ?The  history is provided by the patient. No language interpreter was used.  ?Sore Throat ?Pertinent negatives include no chest pain, no abdominal pain, no headaches and no shortness of breath.  ? ?  ? ?Home Medications ?Prior to Admission medications   ?Medication Sig Start Date End Date Taking? Authorizing Provider  ?busPIRone (BUSPAR) 7.5 MG tablet Take 2 tablets (15 mg total) by mouth 2 (two) times daily as needed. ?Patient not taking: Reported on 09/07/2020 03/22/18   Olive Bass, FNP  ?clindamycin (CLEOCIN) 300 MG capsule Take 1 capsule (300 mg total) by mouth 3 (three) times daily for 7 days. 06/23/21 06/30/21  Sloan Leiter, DO  ?escitalopram (LEXAPRO) 10 MG tablet Take 10 mg by mouth daily. ?Patient not taking: Reported on 09/07/2020    [provider]  ?phentermine (ADIPEX-P) 37.5 MG tablet TAKE 1/2 TABLET IN THE AM X 5DAYS AND INCREASE TO ONE TABLET AFTERWARD IF TOLERATED ?Patient not taking: Reported on 09/07/2020 08/16/18   [provider]  ?   ? ?Allergies    ?Patient has no known allergies.   ? ?Review of Systems   ?Review of Systems  ?  Constitutional:  Negative for chills and fever.  ?HENT:  Positive for sore throat, trouble swallowing and voice change. Negative for facial swelling.   ?Eyes:  Negative for photophobia and visual disturbance.  ?Respiratory:  Negative for cough and shortness of breath.   ?Cardiovascular:  Negative for chest pain and palpitations.  ?Gastrointestinal:  Negative for abdominal pain, nausea and vomiting.  ?Endocrine: Negative for polydipsia and polyuria.  ?Genitourinary:  Negative for difficulty urinating and hematuria.  ?Musculoskeletal:  Negative for gait problem and joint swelling.  ?Skin:  Negative for pallor and rash.  ?Neurological:  Negative for syncope and headaches.  ?Psychiatric/Behavioral:  Negative for agitation and confusion.   ? ?Physical Exam ?Updated Vital Signs ?BP 100/60   Pulse (!) 56   Temp 98.4 ?F (36.9 ?C) (Oral)   Resp 18   Ht 5\' 10"   (1.778 m)   Wt 81.2 kg   LMP 06/16/2021   SpO2 100%   BMI 25.68 kg/m?  ?Physical Exam ?Vitals and nursing note reviewed.  ?Constitutional:   ?   General: She is not in acute distress. ?   Appearance: Normal appearance.  ?HENT:  ?   Head: Normocephalic and atraumatic.  ?   Jaw: There is normal jaw occlusion. No trismus.  ?   Comments: No stridor, drooling or trismus ?   Right Ear: External ear normal.  ?   Left Ear: External ear normal.  ?   Nose: Nose normal.  ?   Mouth/Throat:  ?   Mouth: Mucous membranes are moist.  ?   Pharynx: Pharyngeal swelling present.  ?   Tonsils: Tonsillar exudate present.  ?   Comments: R>L tonsillar swelling ?Uvula is slightly deviated ? ?Eyes:  ?   General: No scleral icterus.    ?   Right eye: No discharge.     ?   Left eye: No discharge.  ?Cardiovascular:  ?   Rate and Rhythm: Normal rate and regular rhythm.  ?   Pulses: Normal pulses.  ?   Heart sounds: Normal heart sounds.  ?Pulmonary:  ?   Effort: Pulmonary effort is normal. No respiratory distress.  ?   Breath sounds: Normal breath sounds.  ?Abdominal:  ?   General: Abdomen is flat.  ?   Palpations: Abdomen is soft.  ?   Tenderness: There is no abdominal tenderness. There is no guarding or rebound.  ?Musculoskeletal:     ?   General: Normal range of motion.  ?   Cervical back: Normal range of motion.  ?   Right lower leg: No edema.  ?   Left lower leg: No edema.  ?Skin: ?   General: Skin is warm and dry.  ?   Capillary Refill: Capillary refill takes less than 2 seconds.  ?Neurological:  ?   Mental Status: She is alert and oriented to person, place, and time.  ?   GCS: GCS eye subscore is 4. GCS verbal subscore is 5. GCS motor subscore is 6.  ?Psychiatric:     ?   Mood and Affect: Mood normal.     ?   Behavior: Behavior normal.  ? ? ?ED Results / Procedures / Treatments   ?Labs ?(all labs ordered are listed, but only abnormal results are displayed) ?Labs Reviewed  ?COMPREHENSIVE METABOLIC PANEL - Abnormal; Notable for the  following components:  ?    Result Value  ? AST 13 (*)   ? Alkaline Phosphatase 36 (*)   ? All other components  within normal limits  ?CBC WITH DIFFERENTIAL/PLATELET  ?HCG, SERUM, QUALITATIVE  ? ? ?EKG ?None ? ?Radiology ?CT Soft Tissue Neck W Contrast ? ?Result Date: 06/23/2021 ?CLINICAL DATA:  Provided history: Tonsil/adenoid disorder. ? right PTA. Sore throat for 1 week. EXAM: CT NECK WITH CONTRAST TECHNIQUE: Multidetector CT imaging of the neck was performed using the standard protocol following the bolus administration of intravenous contrast. RADIATION DOSE REDUCTION: This exam was performed according to the departmental dose-optimization program which includes automated exposure control, adjustment of the mA and/or kV according to patient size and/or use of iterative reconstruction technique. CONTRAST:  64mL OMNIPAQUE IOHEXOL 300 MG/ML  SOLN COMPARISON:  Thyroid ultrasound 11/06/2017. FINDINGS: Pharynx and larynx: Prominence of the palatine tonsils (greater on the right). Findings are compatible with the provided history of acute tonsillitis/pharyngitis in the appropriate clinical setting. Superimposed 12 mm hypodense focus in the region of the right palatine tonsil likely reflecting an early peritonsillar abscess (series 2, image 34). Mild narrowing of the oropharyngeal airway. No appreciable swelling or discrete mass within the larynx. No retropharyngeal collection. Salivary glands: No inflammation, mass, or stone. Thyroid: Unremarkable. Lymph nodes: Bilateral cervical lymphadenopathy. For instance, a left level 2 lymph node measures up to 15 mm in short axis (series 5, image 68). Vascular: The major vascular structures of the neck are patent. Limited intracranial: No evidence of acute intracranial abnormality within the field of view. Visualized orbits: No orbital mass or acute orbital finding. Mastoids and visualized paranasal sinuses: No significant paranasal sinus disease or mastoid effusion at the  imaged levels. Skeleton: Bursal the expected cervical lordosis. Cervical spondylosis. No acute bony abnormality or aggressive osseous lesion. Upper chest: No consolidation within the imaged lung apices. IMPRESSION: Prominence

## 2021-06-23 NOTE — Discharge Instructions (Addendum)
It was a pleasure caring for you today in the emergency department. ? ?Please return to the emergency department for any worsening or worrisome symptoms. ? ?Use salt water gargle three times daily.  ? ?Please see ENT Dr Suszanne Conners in 5-6 days.  ?

## 2021-06-23 NOTE — ED Triage Notes (Addendum)
Pt presents with sore throat x 1 week. Pt seen PCP and was referred to the ED for possible para-tonsillar abscess. Pt was strep negative.  ?

## 2021-06-25 ENCOUNTER — Encounter (HOSPITAL_BASED_OUTPATIENT_CLINIC_OR_DEPARTMENT_OTHER): Payer: Self-pay

## 2021-06-25 ENCOUNTER — Emergency Department (HOSPITAL_BASED_OUTPATIENT_CLINIC_OR_DEPARTMENT_OTHER)
Admission: EM | Admit: 2021-06-25 | Discharge: 2021-06-26 | Disposition: A | Discharge status: Home / Self Care | Payer: Managed Care, Other (non HMO) | Attending: Emergency Medicine | Admitting: Emergency Medicine

## 2021-06-25 ENCOUNTER — Other Ambulatory Visit: Payer: Self-pay

## 2021-06-25 DIAGNOSIS — J36 Peritonsillar abscess: Secondary | ICD-10-CM | POA: Insufficient documentation

## 2021-06-25 DIAGNOSIS — J029 Acute pharyngitis, unspecified: Secondary | ICD-10-CM | POA: Diagnosis present

## 2021-06-25 LAB — CBC WITH DIFFERENTIAL/PLATELET
Abs Immature Granulocytes: 0.02 10*3/uL (ref 0.00–0.07)
Basophils Absolute: 0 10*3/uL (ref 0.0–0.1)
Basophils Relative: 0 %
Eosinophils Absolute: 0.1 10*3/uL (ref 0.0–0.5)
Eosinophils Relative: 1 %
HCT: 36.8 % (ref 36.0–46.0)
Hemoglobin: 12.3 g/dL (ref 12.0–15.0)
Immature Granulocytes: 0 %
Lymphocytes Relative: 50 %
Lymphs Abs: 3.9 10*3/uL (ref 0.7–4.0)
MCH: 31.7 pg (ref 26.0–34.0)
MCHC: 33.4 g/dL (ref 30.0–36.0)
MCV: 94.8 fL (ref 80.0–100.0)
Monocytes Absolute: 0.5 10*3/uL (ref 0.1–1.0)
Monocytes Relative: 6 %
Neutro Abs: 3.3 10*3/uL (ref 1.7–7.7)
Neutrophils Relative %: 43 %
Platelets: 273 10*3/uL (ref 150–400)
RBC: 3.88 MIL/uL (ref 3.87–5.11)
RDW: 12.3 % (ref 11.5–15.5)
WBC: 7.8 10*3/uL (ref 4.0–10.5)
nRBC: 0 % (ref 0.0–0.2)

## 2021-06-25 LAB — BASIC METABOLIC PANEL
Anion gap: 9 (ref 5–15)
BUN: 16 mg/dL (ref 6–20)
CO2: 29 mmol/L (ref 22–32)
Calcium: 10.2 mg/dL (ref 8.9–10.3)
Chloride: 101 mmol/L (ref 98–111)
Creatinine, Ser: 0.8 mg/dL (ref 0.44–1.00)
GFR, Estimated: >60 mL/min (ref >60)
Glucose, Bld: 85 mg/dL (ref 70–99)
Potassium: 3.9 mmol/L (ref 3.5–5.1)
Sodium: 139 mmol/L (ref 135–145)

## 2021-06-25 MED ORDER — MORPHINE SULFATE (PF) 4 MG/ML IV SOLN
4.0000 mg | Freq: Once | INTRAVENOUS | Status: AC
  Administered 2021-06-25: 4 mg via INTRAVENOUS
  Filled 2021-06-25: qty 1

## 2021-06-25 MED ORDER — SODIUM CHLORIDE 0.9 % IV BOLUS
1000.0000 mL | Freq: Once | INTRAVENOUS | Status: AC
  Administered 2021-06-25: 1000 mL via INTRAVENOUS

## 2021-06-25 MED ORDER — DEXAMETHASONE SODIUM PHOSPHATE 10 MG/ML IJ SOLN
10.0000 mg | Freq: Once | INTRAMUSCULAR | Status: AC
  Administered 2021-06-25: 10 mg via INTRAVENOUS
  Filled 2021-06-25: qty 1

## 2021-06-25 MED ORDER — ONDANSETRON HCL 4 MG/2ML IJ SOLN
4.0000 mg | Freq: Once | INTRAMUSCULAR | Status: AC
  Administered 2021-06-25: 4 mg via INTRAVENOUS
  Filled 2021-06-25: qty 2

## 2021-06-25 MED ORDER — KETOROLAC TROMETHAMINE 30 MG/ML IJ SOLN
30.0000 mg | Freq: Once | INTRAMUSCULAR | Status: AC
  Administered 2021-06-25: 30 mg via INTRAVENOUS
  Filled 2021-06-25: qty 1

## 2021-06-25 MED ORDER — AMOXICILLIN-POT CLAVULANATE 875-125 MG PO TABS: 1.0000 | ORAL_TABLET | Freq: Two times a day (BID) | ORAL | 0 refills | Status: AC

## 2021-06-25 NOTE — ED Notes (Signed)
Pt has water at bed side. Pt stated it was easier to drink water after being medicated. ? ?

## 2021-06-25 NOTE — ED Provider Notes (Signed)
?[facility] ?Provider Note ? ? ?CSN: [id number] ?Arrival date & time: 06/25/21  1910 ? ?  ?History ? ?Chief Complaint  ?Patient presents with  ? Sore Throat  ? ? ?Debra Fox is a 31 y.o. female here for evaluation of sore throat.  Diagnosed with peritonsillar abscess, 48 hours ago.  Previous provider had consult with Dr. Suszanne Conners with ENT and wanted her to follow-up outpatient with abx, steroids.  Patient states she thought she was doing better until today she developed some recurrent pain.  No pain in voice.  Pain located right side of neck.  No fever, shortness of breath, sensation of throat closing.  Compliant with meds at home.  She has appointment with ENT Dr. Suszanne Conners on Thursday. Tolerating PO. No pooling of secretions. ? ?HPI ? ?  ? ?Home Medications ?Prior to Admission medications   ?Medication Sig Start Date End Date Taking? Authorizing Provider  ?amoxicillin-clavulanate (AUGMENTIN) 875-125 MG tablet Take 1 tablet by mouth every 12 (twelve) hours. 06/25/21  Yes Shaunie Boehm A, PA-C  ?busPIRone (BUSPAR) 7.5 MG tablet Take 2 tablets (15 mg total) by mouth 2 (two) times daily as needed. ?Patient not taking: Reported on 09/07/2020 03/22/18   Olive Bass, FNP  ?clindamycin (CLEOCIN) 300 MG capsule Take 1 capsule (300 mg total) by mouth 3 (three) times daily for 7 days. 06/23/21 06/30/21  Sloan Leiter, DO  ?escitalopram (LEXAPRO) 10 MG tablet Take 10 mg by mouth daily. ?Patient not taking: Reported on 09/07/2020    [provider]  ?phentermine (ADIPEX-P) 37.5 MG tablet TAKE 1/2 TABLET IN THE AM X 5DAYS AND INCREASE TO ONE TABLET AFTERWARD IF TOLERATED ?Patient not taking: Reported on 09/07/2020 08/16/18   [provider]  ?   ? ?Allergies    ?Patient has no known allergies.   ? ?Review of Systems   ?Review of Systems  ?Constitutional: Negative.   ?HENT:  Positive for sore throat. Negative for congestion, drooling, ear discharge, facial swelling, postnasal drip,  rhinorrhea, sinus pressure, sinus pain, sneezing, tinnitus, trouble swallowing and voice change.   ?Respiratory: Negative.    ?Cardiovascular: Negative.   ?Gastrointestinal: Negative.   ?Genitourinary: Negative.   ?Musculoskeletal: Negative.   ?Skin: Negative.   ?Neurological: Negative.   ?All other systems reviewed and are negative. ? ?Physical Exam ?Updated Vital Signs ?BP 119/80 (BP Location: Right Arm)   Pulse (!) 50   Temp 98 ?F (36.7 ?C) (Oral)   Resp 16   Ht 5\' 10"  (1.778 m)   Wt 81.2 kg   LMP 06/16/2021   SpO2 97%   BMI 25.69 kg/m?  ?Physical Exam ?Vitals and nursing note reviewed.  ?Constitutional:   ?   General: She is not in acute distress. ?   Appearance: She is well-developed. She is not ill-appearing, toxic-appearing or diaphoretic.  ?HENT:  ?   Head: Normocephalic and atraumatic.  ?   Jaw: There is normal jaw occlusion.  ?   Comments: Tongue midline.  Slight deviation uvula to the left.  Right-sided peritonsillar fullness with some mild erythema.  No pooling of secretions.  No drooling, dysphagia or trismus. ?   Nose: No congestion or rhinorrhea.  ?   Mouth/Throat:  ?   Lips: Pink.  ?   Mouth: Mucous membranes are moist.  ?   Tongue: No lesions. Tongue does not deviate from midline.  ?   Tonsils: Tonsillar abscess present. No tonsillar exudate.  ?Eyes:  ?   Pupils: Pupils  are equal, round, and reactive to light.  ?Neck:  ?   Trachea: Trachea and phonation normal.  ?   Comments: No neck stiffness or neck rigidity ?No Meningismus ?Cardiovascular:  ?   Rate and Rhythm: Normal rate.  ?   Pulses: Normal pulses.  ?   Heart sounds: Normal heart sounds.  ?Pulmonary:  ?   Effort: Pulmonary effort is normal. No respiratory distress.  ?   Breath sounds: Normal breath sounds and air entry.  ?   Comments: Clear bilaterally, speaks in full sentences, no hot potato voice ?Abdominal:  ?   General: Bowel sounds are normal. There is no distension.  ?   Palpations: Abdomen is soft.  ?Musculoskeletal:     ?    General: Normal range of motion.  ?   Cervical back: Full passive range of motion without pain and normal range of motion.  ?Skin: ?   General: Skin is warm and dry.  ?Neurological:  ?   General: No focal deficit present.  ?   Mental Status: She is alert.  ?Psychiatric:     ?   Mood and Affect: Mood normal.  ? ? ?ED Results / Procedures / Treatments   ?Labs ?(all labs ordered are listed, but only abnormal results are displayed) ?Labs Reviewed  ?CBC WITH DIFFERENTIAL/PLATELET  ?BASIC METABOLIC PANEL  ? ? ?EKG ?None ? ?Radiology ?No results found. ? ?Procedures ?Procedures  ? ? ?Medications Ordered in ED ?Medications  ?sodium chloride 0.9 % bolus 1,000 mL (1,000 mLs Intravenous New Bag/Given 06/25/21 2150)  ?ondansetron [facility](ZOFRAN) injection 4 mg (4 mg Intravenous Given 06/25/21 2201)  ?morphine (PF) 4 MG/ML injection 4 mg (4 mg Intravenous Given 06/25/21 2203)  ?ketorolac (TORADOL) 30 MG/ML injection 30 mg (30 mg Intravenous Given 06/25/21 2202)  ?dexamethasone (DECADRON) injection 10 mg (10 mg Intravenous Given 06/25/21 2205)  ? ?ED Course/ Medical Decision Making/ A&P ?  ? ?31 year old here for evaluation of sore throat.  Seen in ED 2 days ago diagnosed with small peritonsillar abscess.  Previous provider had consulted with Dr. Suszanne Connerseoh with ENT.  Recommended steroids, antibiotics and follow-up outpatient.  Patient has appointment on Thursday.  States initially thought she was doing better, today developed some recurrent throat pain.  On exam she does have some slight deviation uvula to the left with some right peritonsillar fullness.  Her airway is patent.  She is tolerating her secretions.  No change in voice.  She has no neck stiffness neck rigidity.  She has no meningismus.  Plan on pain management, IV fluids and reassess. ? ?Labs personally viewed and interpreted: ? ?CBC without leukocytosis ?Metabolic panel without significant normality ? ?Patient reassessed.  Feels improved, feels like she can follow-up outpatient as  previously discussed with ENT provider.  I feel this is reasonable given her exam. Tolerating p.o. intake.  No voice changes.  No pooling of secretions.  She has follow-up with ENT.  She does voice some concern that she has had antibiotic resistance previously.  We will start Augmentin. ? ?Discussed return precautions.  She will return for any worsening complaints.  I did encourage her to call ear nose and throat office tomorrow to see if she can get in sooner and let them know she was seen again here in the ED. ? ?The patient has been appropriately medically screened and/or stabilized in the ED. I have low suspicion for any other emergent medical condition which would require further screening, evaluation or treatment in the ED  or require inpatient management. ? ?Patient is hemodynamically stable and in no acute distress.  Patient able to ambulate in department prior to ED.  Evaluation does not show acute pathology that would require ongoing or additional emergent interventions while in the emergency department or further inpatient treatment.  I have discussed the diagnosis with the patient and answered all questions.  Pain is been managed while in the emergency department and patient has no further complaints prior to discharge.  Patient is comfortable with plan discussed in room and is stable for discharge at this time.  I have discussed strict return precautions for returning to the emergency department.  Patient was encouraged to follow-up with PCP/specialist refer to at discharge.  ? ?Discussed with attending, agreeable for plan. ? ?                        ?Medical Decision Making ?Amount and/or Complexity of Data Reviewed ?External Data Reviewed: labs, radiology and notes. ?Labs: ordered. Decision-making details documented in ED Course. ? ?Risk ?OTC drugs. ?Prescription drug management. ?Diagnosis or treatment significantly limited by social determinants of health. ? ? ? ? ? ? ? ? ?Final Clinical  Impression(s) / ED Diagnoses ?Final diagnoses:  ?Peritonsillar abscess  ? ? ?Rx / DC Orders ?ED Discharge Orders   ? ?      Ordered  ?  amoxicillin-clavulanate (AUGMENTIN) 875-125 MG tablet  Every 12 hours       ? 06/25/21 2302  ? ?

## 2021-06-25 NOTE — ED Triage Notes (Signed)
Patient here POV from Home. ? ?Endorses being in ED a few days PTA for a Sore Throat that had been present for approximately 1 week. Diagnosed with Peri-Tonsillar Abscess.  ? ?Discharged and instructed to follow up with ENT and return if Symptoms worsened. Symptoms became worse today. ? ?NAD Noted during Triage. A&Ox4. GCS 15. Ambulatory. ?

## 2021-06-25 NOTE — Discharge Instructions (Addendum)
Follow up  with Dr. Avel Sensor office tomorrow ? ?Take the antibiotic ? ?Return for any worsening symptoms ?

## 2021-08-01 ENCOUNTER — Encounter: Payer: Self-pay | Admitting: Physician Assistant

## 2021-08-23 ENCOUNTER — Ambulatory Visit: Payer: Managed Care, Other (non HMO) | Admitting: Nurse Practitioner

## 2021-11-25 LAB — HM COLONOSCOPY

## 2021-11-26 ENCOUNTER — Encounter: Payer: Self-pay | Admitting: Family

## 2023-04-28 IMAGING — CT CT NECK W/ CM
3 of 5 series · 10 of 33 positions shown, 12 images · IV contrast (APPLIED)
Comparison: Thyroid ultrasound 11/06/2017.

CLINICAL DATA: Provided history: Tonsil/adenoid disorder. ? right
PTA. Sore throat for 1 week.

EXAM:
CT NECK WITH CONTRAST
TECHNIQUE: Multidetector CT imaging of the neck was performed using the
standard protocol following the bolus administration of intravenous
contrast.

[Series 4: cor neck · coronal · 0.56mm/px · 3 of 94 slices shown]
[im 26/94  bone]
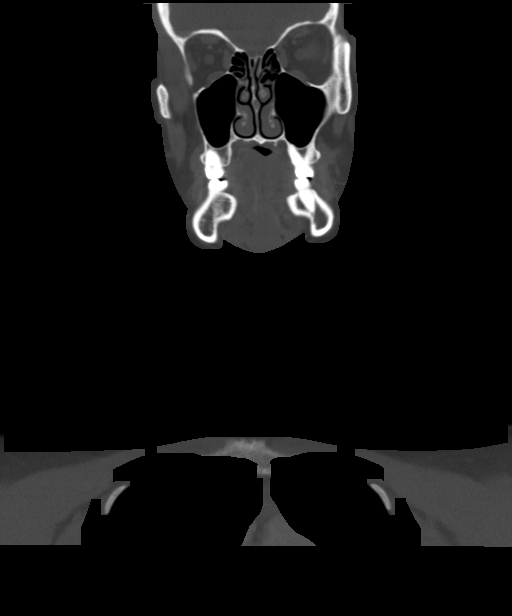
[im 40/94  bone]
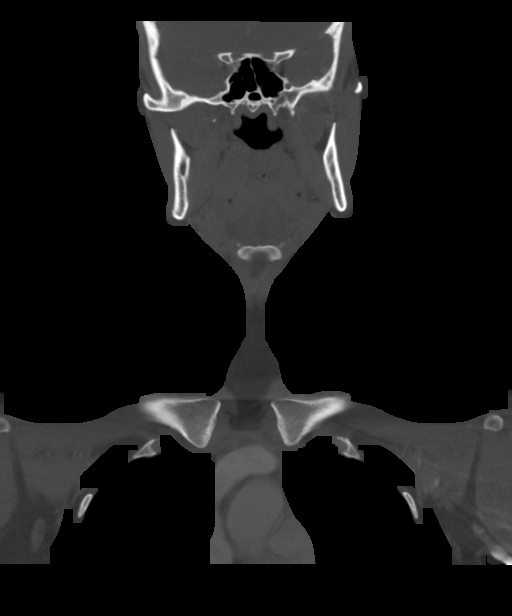
[im 54/94  bone]
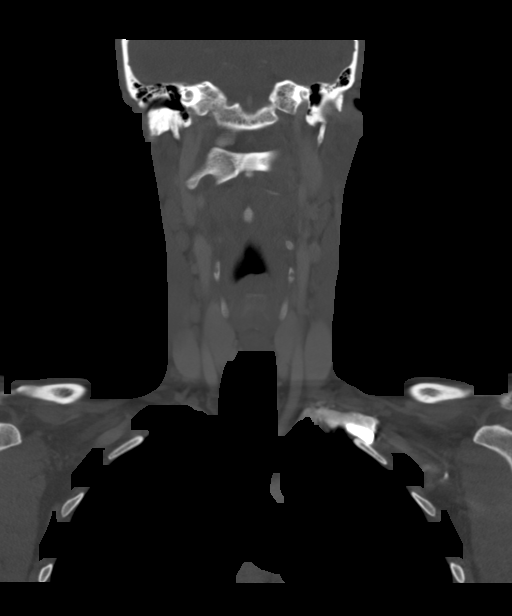

[Series 5: sag neck · sagittal · 0.37mm/px · 5 of 92 slices shown, 6 images]
[im 31/92  bone]
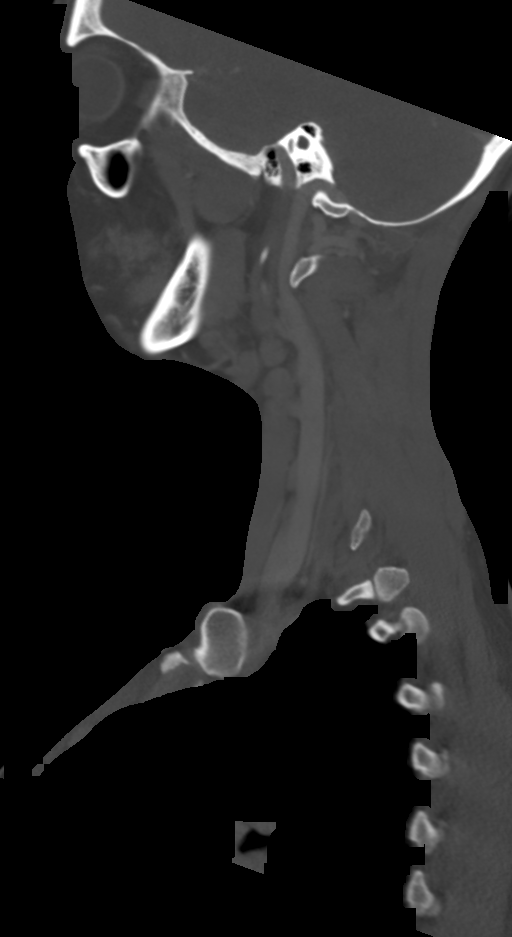
[im 38/92  bone]
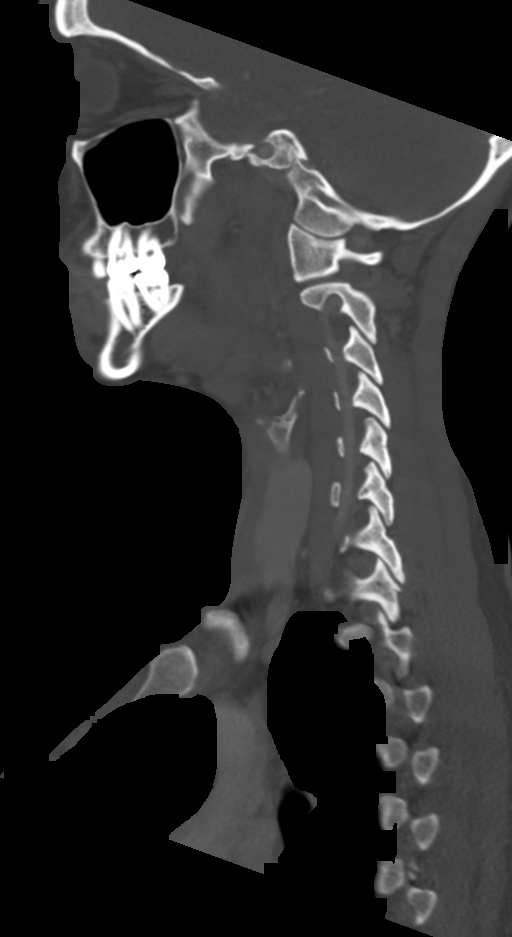
[im 46/92  soft-tissue]
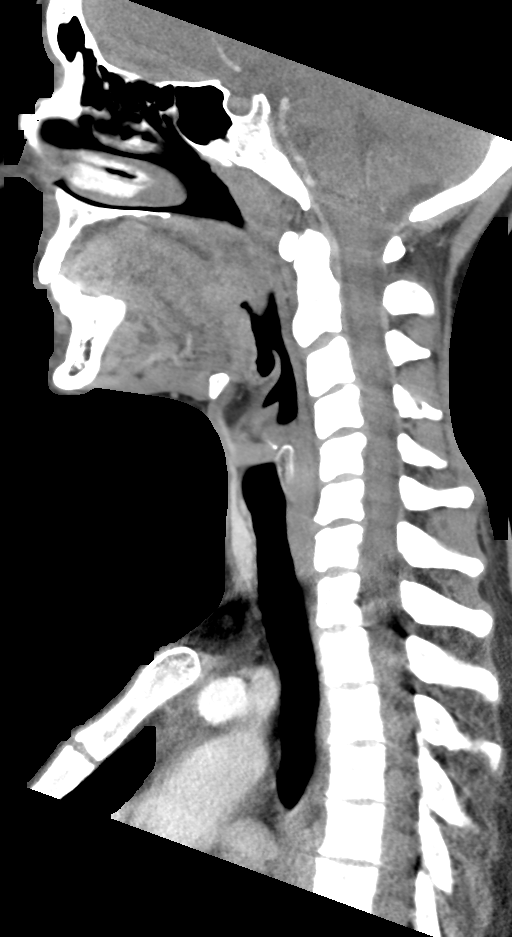
[im 46/92  bone]
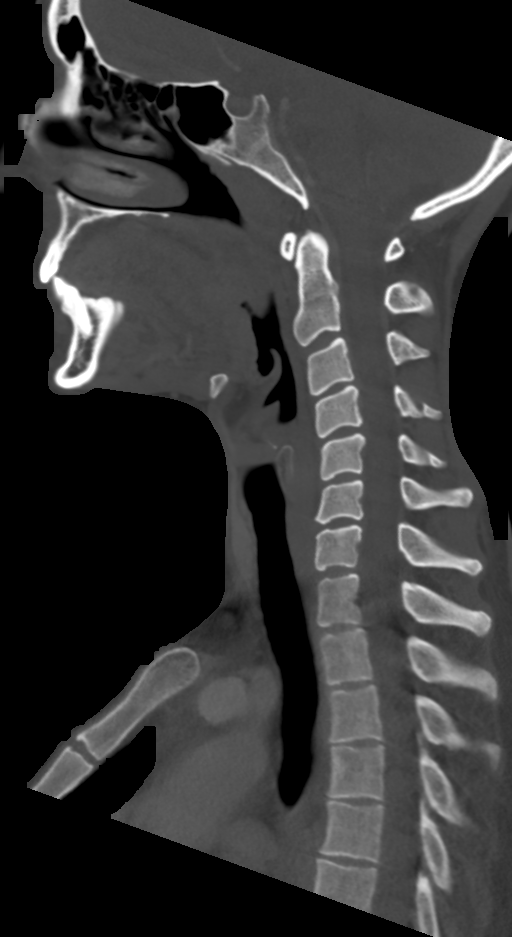
[im 54/92  bone]
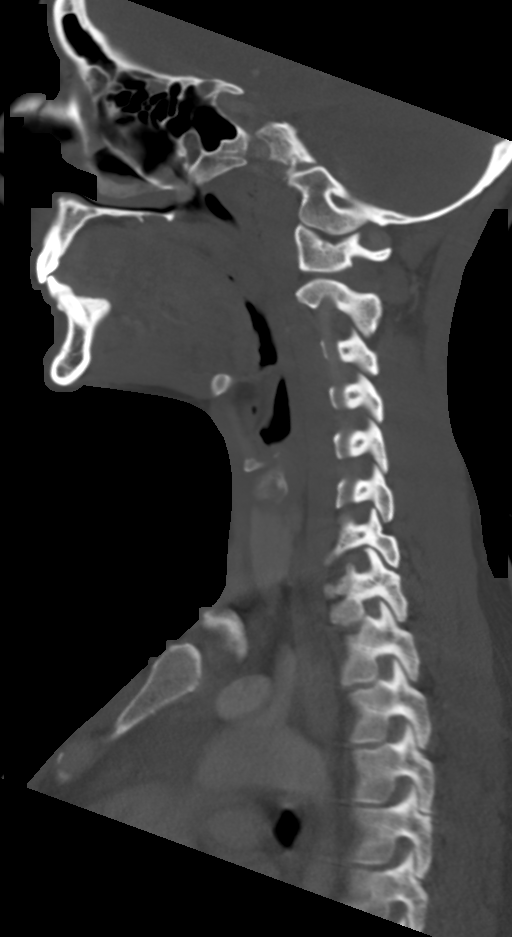
[im 61/92  bone]
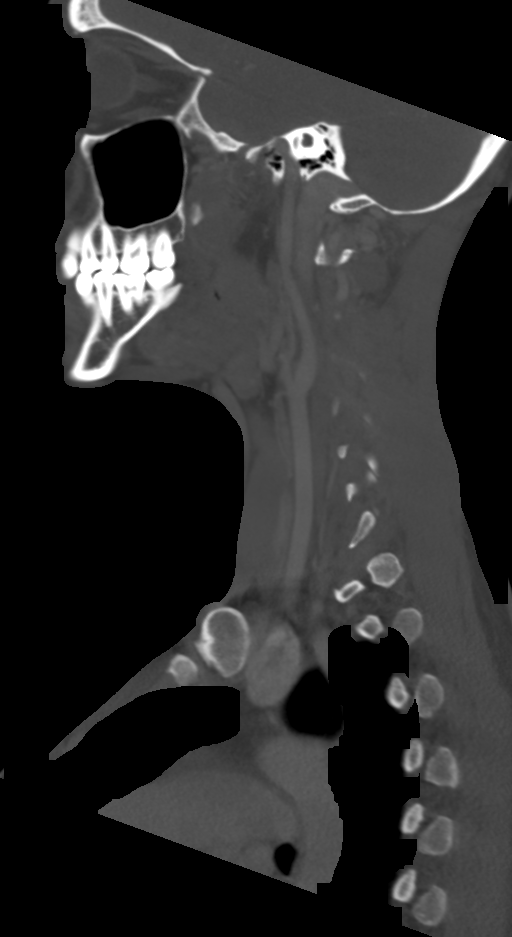

[Series 6: ax oropharynx · axial · 0.37mm/px · z∈[-293,-135]mm · 2 of 170 slices shown, 3 images]
[im 43/170  soft-tissue]
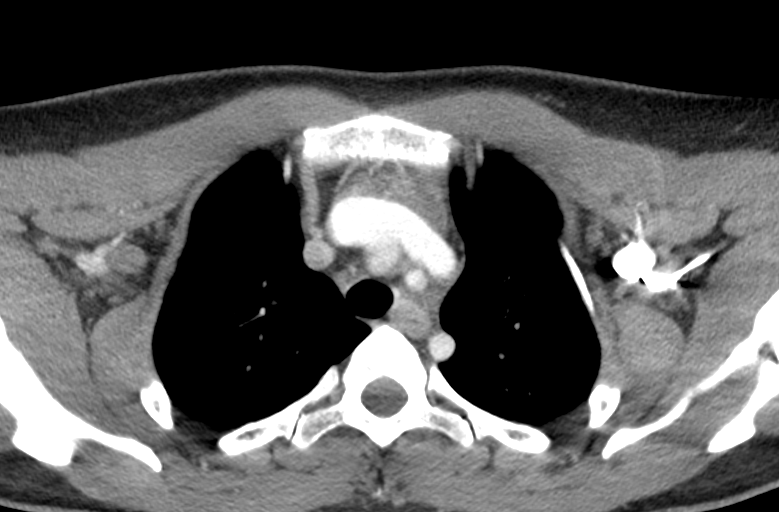
[im 43/170  bone]
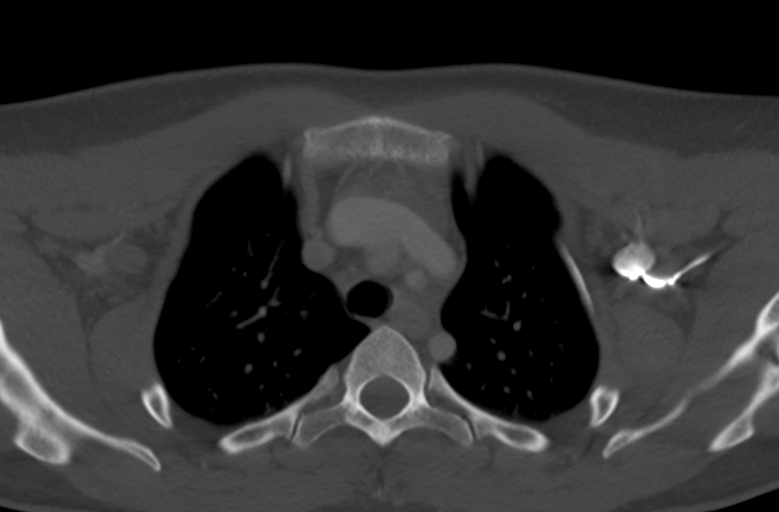
[im 127/170  bone]
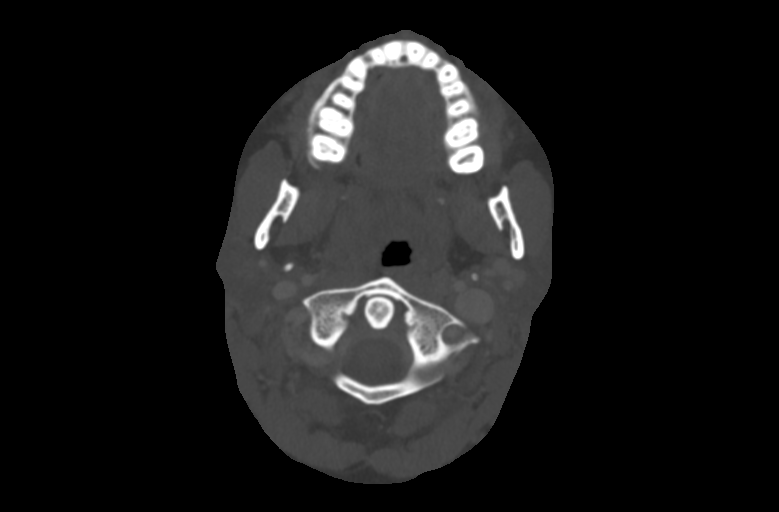

[10 of 33 positions shown; findings below may reference images not displayed]

RADIATION DOSE REDUCTION: This exam was performed according to the
departmental dose-optimization program which includes automated
exposure control, adjustment of the mA and/or kV according to
patient size and/or use of iterative reconstruction technique.

CONTRAST:  75mL OMNIPAQUE IOHEXOL 300 MG/ML  SOLN
FINDINGS: Pharynx and larynx: Prominence of the palatine tonsils (greater on
the right). Findings are compatible with the provided history of
acute tonsillitis/pharyngitis in the appropriate clinical setting.
Superimposed 12 mm hypodense focus in the region of the right
palatine tonsil likely reflecting an early peritonsillar abscess
(series 2, image 34). Mild narrowing of the oropharyngeal airway. No
appreciable swelling or discrete mass within the larynx. No
retropharyngeal collection.

Salivary glands: No inflammation, mass, or stone.

Thyroid: Unremarkable.

Lymph nodes: Bilateral cervical lymphadenopathy. For instance, a
left level 2 lymph node measures up to 15 mm in short axis (series
5, image 68).

Vascular: The major vascular structures of the neck are patent.

Limited intracranial: No evidence of acute intracranial abnormality
within the field of view.

Visualized orbits: No orbital mass or acute orbital finding.

Mastoids and visualized paranasal sinuses: No significant paranasal
sinus disease or mastoid effusion at the imaged levels.

Skeleton: Bursal the expected cervical lordosis. Cervical
spondylosis. No acute bony abnormality or aggressive osseous lesion.

Upper chest: No consolidation within the imaged lung apices.
IMPRESSION: Prominence of the palatine tonsils (greater on the right). Findings
are compatible with acute tonsillitis/pharyngitis in the appropriate
clinical setting. Superimposed 12 mm ovoid hypodense focus in the
region of the right palatine tonsil, likely reflecting an early
peritonsillar abscess. Mild narrowing of the oropharyngeal airway.

Cervical lymphadenopathy, likely reactive.
